# Patient Record
Sex: Female | Born: 1990 | Race: White | Hispanic: No | Marital: Married | Smoking: Current every day smoker
Health system: Southern US, Community
[De-identification: ages and names within clinical notes are randomized; demographics above are authoritative.]

## PROBLEM LIST (undated history)

## (undated) DIAGNOSIS — F419 Anxiety disorder, unspecified: Secondary | ICD-10-CM

## (undated) DIAGNOSIS — Z87440 Personal history of urinary (tract) infections: Secondary | ICD-10-CM

## (undated) DIAGNOSIS — F32A Depression, unspecified: Secondary | ICD-10-CM

## (undated) DIAGNOSIS — Z8742 Personal history of other diseases of the female genital tract: Secondary | ICD-10-CM

## (undated) DIAGNOSIS — R011 Cardiac murmur, unspecified: Secondary | ICD-10-CM

## (undated) DIAGNOSIS — R87619 Unspecified abnormal cytological findings in specimens from cervix uteri: Secondary | ICD-10-CM

## (undated) DIAGNOSIS — F319 Bipolar disorder, unspecified: Secondary | ICD-10-CM

## (undated) DIAGNOSIS — E119 Type 2 diabetes mellitus without complications: Secondary | ICD-10-CM

## (undated) DIAGNOSIS — F329 Major depressive disorder, single episode, unspecified: Secondary | ICD-10-CM

## (undated) DIAGNOSIS — Z8759 Personal history of other complications of pregnancy, childbirth and the puerperium: Secondary | ICD-10-CM

## (undated) DIAGNOSIS — I10 Essential (primary) hypertension: Secondary | ICD-10-CM

## (undated) HISTORY — DX: Anxiety disorder, unspecified: F41.9

## (undated) HISTORY — DX: Depression, unspecified: F32.A

## (undated) HISTORY — DX: Bipolar disorder, unspecified: F31.9

## (undated) HISTORY — DX: Essential (primary) hypertension: I10

## (undated) HISTORY — DX: Cardiac murmur, unspecified: R01.1

## (undated) HISTORY — DX: Major depressive disorder, single episode, unspecified: F32.9

## (undated) HISTORY — DX: Personal history of urinary (tract) infections: Z87.440

## (undated) HISTORY — DX: Type 2 diabetes mellitus without complications: E11.9

## (undated) HISTORY — DX: Personal history of other diseases of the female genital tract: Z87.42

## (undated) HISTORY — DX: Unspecified abnormal cytological findings in specimens from cervix uteri: R87.619

## (undated) HISTORY — PX: COLPOSCOPY: SHX161

## (undated) HISTORY — DX: Personal history of other complications of pregnancy, childbirth and the puerperium: Z87.59

---

## 2018-10-29 ENCOUNTER — Other Ambulatory Visit (HOSPITAL_COMMUNITY)
Admission: RE | Admit: 2018-10-29 | Discharge: 2018-10-29 | Disposition: A | Discharge status: Home / Self Care | Payer: BLUE CROSS/BLUE SHIELD | Source: Ambulatory Visit | Attending: Certified Nurse Midwife | Admitting: Certified Nurse Midwife

## 2018-10-29 ENCOUNTER — Encounter: Payer: Self-pay | Admitting: Certified Nurse Midwife

## 2018-10-29 ENCOUNTER — Ambulatory Visit (INDEPENDENT_AMBULATORY_CARE_PROVIDER_SITE_OTHER): Payer: BLUE CROSS/BLUE SHIELD | Admitting: Certified Nurse Midwife

## 2018-10-29 VITALS — BP 126/92 | HR 89 | Ht 66.0 in | Wt 194.6 lb

## 2018-10-29 DIAGNOSIS — N941 Unspecified dyspareunia: Secondary | ICD-10-CM

## 2018-10-29 DIAGNOSIS — Z124 Encounter for screening for malignant neoplasm of cervix: Secondary | ICD-10-CM

## 2018-10-29 DIAGNOSIS — Z8632 Personal history of gestational diabetes: Secondary | ICD-10-CM

## 2018-10-29 DIAGNOSIS — Z01419 Encounter for gynecological examination (general) (routine) without abnormal findings: Secondary | ICD-10-CM

## 2018-10-29 NOTE — Progress Notes (Signed)
GYNECOLOGY ANNUAL PREVENTATIVE CARE ENCOUNTER NOTE  Subjective:   Janice Kaufman is a 28 y.o. [id number] female here for a routine annual gynecologic exam.  Current complaints: occasional painful intercourse x 3 yrs.   Denies abnormal vaginal bleeding, discharge, pelvic pain, problems with intercourse or other gynecologic concerns.    Gynecologic History Patient's last menstrual period was 10/29/2018 (exact date). Contraception: condoms, family planning  Last Pap: 2018. Results were: abnormal pt unsure of result  Last mammogram: n/a.  Obstetric History OB History  Gravida Para Term Preterm AB Living  3 3 3     3   SAB TAB Ectopic Multiple Live Births          3    # Outcome Date GA Lbr Len/2nd Weight Sex Delivery Anes PTL Lv  3 Term 2018   7 lb 7 oz (3.374 kg) M Vag-Spont   LIV  2 Term 2013   8 lb 7 oz (3.827 kg) M Vag-Spont   LIV  1 Term 2012   6 lb 3 oz (2.807 kg) F Vag-Spont   LIV    Past Medical History:  Diagnosis Date  . Anxiety   . Depression     History reviewed. No pertinent surgical history.  No current outpatient medications on file prior to visit.   No current facility-administered medications on file prior to visit.   History of GDM , pre eclampsia, and gestational hypertension  No Known Allergies  Social History:  reports that she has been smoking. She has never used smokeless tobacco. She reports that she does not drink alcohol or use drugs.  Family History  Problem Relation Age of Onset  . Cancer Mother   . Diabetes Mother   . Thyroid disease Mother   . Ovarian cancer Paternal Grandmother     The following portions of the patient's history were reviewed and updated as appropriate: allergies, current medications, past family history, past medical history, past social history, past surgical history and problem list.  Pt smokes pack a day x 12 yrs Denies alcohol, drugs.  Exercise: none.  Review of Systems Pertinent items noted in HPI and remainder  of comprehensive ROS otherwise negative.   Objective:  BP (!) 128/100   Pulse 96   Ht 5\' 6"  (1.676 m)   Wt 194 lb 9 oz (88.3 kg)   LMP 10/29/2018 (Exact Date)   BMI 31.40 kg/m  CONSTITUTIONAL: Well-developed, well-nourished, obese female in no acute distress.  HENT:  Normocephalic, atraumatic, External right and left ear normal. Oropharynx is clear and moist EYES: Conjunctivae and EOM are normal. Pupils are equal, round, and reactive to light. No scleral icterus.  NECK: Normal range of motion, supple, no masses.  Normal thyroid.  SKIN: Skin is warm and dry. No rash noted. Not diaphoretic. No erythema. No pallor. MUSCULOSKELETAL: Normal range of motion. No tenderness.  No cyanosis, clubbing, or edema.  2+ distal pulses. NEUROLOGIC: Alert and oriented to person, place, and time. Normal reflexes, muscle tone coordination. No cranial nerve deficit noted. PSYCHIATRIC: Normal mood and affect. Normal behavior. Normal judgment and thought content. CARDIOVASCULAR: Normal heart rate noted, regular rhythm RESPIRATORY: Clear to auscultation bilaterally. Effort and breath sounds normal, no problems with respiration noted. BREASTS: Symmetric in size. No masses, skin changes, nipple drainage, or lymphadenopathy. ABDOMEN: Soft, normal bowel sounds, no distention noted.  No tenderness, rebound or guarding.  PELVIC: Normal appearing external genitalia; normal appearing vaginal mucosa and cervix.  No abnormal discharge noted. Pt started period today. Moderate  amount of blood present  Pap smear obtained.  Normal uterine size, no other palpable masses, no uterine or adnexal tenderness.    Assessment and Plan:  Women's annual routine GYN exam  Will follow up results of pap smear and manage accordingly. Mammogram n/a Labs: lipid profile, Hemoglobin A 1 C  Future: u/s pelvic dyspareunia   She denies problems with BP outside of pregnancy. She states is usually 120/80's.Repeat BP 126/92. Pt encouraged to keep  watch on it and see PCP if BP remains elevated. She verbalizes understanding.   Routine preventative health maintenance measures emphasized. Please refer to After Visit Summary for other counseling recommendations.    Doreene Burke, CNM

## 2018-10-29 NOTE — Patient Instructions (Signed)
Preventive Care 18-39 Years, Female Preventive care refers to lifestyle choices and visits with your health care provider that can promote health and wellness. What does preventive care include?   A yearly physical exam. This is also called an annual well check.  Dental exams once or twice a year.  Routine eye exams. Ask your health care provider how often you should have your eyes checked.  Personal lifestyle choices, including: ? Daily care of your teeth and gums. ? Regular physical activity. ? Eating a healthy diet. ? Avoiding tobacco and drug use. ? Limiting alcohol use. ? Practicing safe sex. ? Taking vitamin and mineral supplements as recommended by your health care provider. What happens during an annual well check? The services and screenings done by your health care provider during your annual well check will depend on your age, overall health, lifestyle risk factors, and family history of disease. Counseling Your health care provider may ask you questions about your:  Alcohol use.  Tobacco use.  Drug use.  Emotional well-being.  Home and relationship well-being.  Sexual activity.  Eating habits.  Work and work Statistician.  Method of birth control.  Menstrual cycle.  Pregnancy history. Screening You may have the following tests or measurements:  Height, weight, and BMI.  Diabetes screening. This is done by checking your blood sugar (glucose) after you have not eaten for a while (fasting).  Blood pressure.  Lipid and cholesterol levels. These may be checked every 5 years starting at age 31.  Skin check.  Hepatitis C blood test.  Hepatitis B blood test.  Sexually transmitted disease (STD) testing.  BRCA-related cancer screening. This may be done if you have a family history of breast, ovarian, tubal, or peritoneal cancers.  Pelvic exam and Pap test. This may be done every 3 years starting at age 18. Starting at age 67, this may be done every 5  years if you have a Pap test in combination with an HPV test. Discuss your test results, treatment options, and if necessary, the need for more tests with your health care provider. Vaccines Your health care provider may recommend certain vaccines, such as:  Influenza vaccine. This is recommended every year.  Tetanus, diphtheria, and acellular pertussis (Tdap, Td) vaccine. You may need a Td booster every 10 years.  Varicella vaccine. You may need this if you have not been vaccinated.  HPV vaccine. If you are 53 or younger, you may need three doses over 6 months.  Measles, mumps, and rubella (MMR) vaccine. You may need at least one dose of MMR. You may also need a second dose.  Pneumococcal 13-valent conjugate (PCV13) vaccine. You may need this if you have certain conditions and were not previously vaccinated.  Pneumococcal polysaccharide (PPSV23) vaccine. You may need one or two doses if you smoke cigarettes or if you have certain conditions.  Meningococcal vaccine. One dose is recommended if you are age 44-21 years and a first-year college student living in a residence hall, or if you have one of several medical conditions. You may also need additional booster doses.  Hepatitis A vaccine. You may need this if you have certain conditions or if you travel or work in places where you may be exposed to hepatitis A.  Hepatitis B vaccine. You may need this if you have certain conditions or if you travel or work in places where you may be exposed to hepatitis B.  Haemophilus influenzae type b (Hib) vaccine. You may need this if you  have certain risk factors. Talk to your health care provider about which screenings and vaccines you need and how often you need them. This information is not intended to replace advice given to you by your health care provider. Make sure you discuss any questions you have with your health care provider. Document Released: 10/28/2001 Document Revised: 04/14/2017  Document Reviewed: 07/03/2015 Elsevier Interactive Patient Education  2019 [employer].

## 2018-10-30 LAB — LIPID PANEL
Chol/HDL Ratio: 5.6 ratio — ABNORMAL HIGH (ref 0.0–4.4)
Cholesterol, Total: 146 mg/dL (ref 100–199)
HDL: 26 mg/dL — ABNORMAL LOW (ref >39)
LDL CALC: 81 mg/dL (ref 0–99)
Triglycerides: 193 mg/dL — ABNORMAL HIGH (ref 0–149)
VLDL Cholesterol Cal: 39 mg/dL (ref 5–40)

## 2018-10-30 LAB — HEMOGLOBIN A1C
Est. average glucose Bld gHb Est-mCnc: 232 mg/dL
Hgb A1c MFr Bld: 9.7 % — ABNORMAL HIGH (ref 4.8–5.6)

## 2018-11-01 ENCOUNTER — Other Ambulatory Visit: Payer: Self-pay | Admitting: Certified Nurse Midwife

## 2018-11-01 DIAGNOSIS — R7309 Other abnormal glucose: Secondary | ICD-10-CM

## 2018-11-01 NOTE — Progress Notes (Signed)
Elevated hemoglobin A1c in annual exam. Order placed for referral to endocrinology.   Doreene Burke, CNM

## 2018-11-02 ENCOUNTER — Ambulatory Visit (INDEPENDENT_AMBULATORY_CARE_PROVIDER_SITE_OTHER): Payer: BLUE CROSS/BLUE SHIELD

## 2018-11-02 DIAGNOSIS — N941 Unspecified dyspareunia: Secondary | ICD-10-CM

## 2018-11-04 LAB — CYTOLOGY - PAP
HPV 16/18/45 genotyping: POSITIVE — AB
HPV: DETECTED — AB

## 2018-11-05 ENCOUNTER — Telehealth: Payer: Self-pay

## 2018-11-05 NOTE — Telephone Encounter (Signed)
Per ATs orders- info mailed.

## 2018-11-16 ENCOUNTER — Other Ambulatory Visit: Payer: Self-pay

## 2018-11-16 ENCOUNTER — Encounter: Payer: Self-pay | Admitting: Internal Medicine

## 2018-11-16 ENCOUNTER — Ambulatory Visit (INDEPENDENT_AMBULATORY_CARE_PROVIDER_SITE_OTHER): Payer: BLUE CROSS/BLUE SHIELD | Admitting: Internal Medicine

## 2018-11-16 VITALS — BP 136/88 | HR 79 | Ht 66.0 in | Wt 196.0 lb

## 2018-11-16 DIAGNOSIS — E1165 Type 2 diabetes mellitus with hyperglycemia: Secondary | ICD-10-CM | POA: Diagnosis not present

## 2018-11-16 LAB — BASIC METABOLIC PANEL
BUN: 12 mg/dL (ref 6–23)
CO2: 22 mEq/L (ref 19–32)
Calcium: 9.2 mg/dL (ref 8.4–10.5)
Chloride: 101 mEq/L (ref 96–112)
Creatinine, Ser: 0.58 mg/dL (ref 0.40–1.20)
GFR: 126.28 mL/min (ref >60.00)
Glucose, Bld: 239 mg/dL — ABNORMAL HIGH (ref 70–99)
Potassium: 4 mEq/L (ref 3.5–5.1)
Sodium: 133 mEq/L — ABNORMAL LOW (ref 135–145)

## 2018-11-16 LAB — MICROALBUMIN / CREATININE URINE RATIO
Creatinine,U: 86.5 mg/dL
MICROALB/CREAT RATIO: 66.7 mg/g — AB (ref 0.0–30.0)
Microalb, Ur: 57.7 mg/dL — ABNORMAL HIGH (ref 0.0–1.9)

## 2018-11-16 MED ORDER — METFORMIN HCL 500 MG PO TABS: 1000.0000 mg | ORAL_TABLET | Freq: Two times a day (BID) | ORAL | 6 refills | Status: DC

## 2018-11-16 NOTE — Progress Notes (Signed)
Name: Janice Kaufman  MRN/ DOB: [id number], 1991-06-30   Age/ Sex: 28 y.o., female    PCP: Patient, No Pcp Per   Reason for Endocrinology Evaluation: Type 2 Diabetes Mellitus     Date of Initial Endocrinology Visit: 11/17/2018     PATIENT IDENTIFIER: Janice Kaufman is a 28 y.o. female with a past medical history of gestational diabetes and preeclampsia. The patient presented for initial endocrinology clinic visit on 11/17/2018 for consultative assistance with her diabetes management.    HPI: Janice Kaufman was    Diagnosed with Gestational diabetes during her 3rd pregnancy of her currently 62 month old boy, she was supposed to have repeat testing but never did. During her teenage years, she was diagnosed with pre-diabetes and started on Metformin , that she took for a year, she denies any side effects with it. Prior Medications tried/Intolerance: Metformin - denies intolerance.  Currently checking blood sugars 0 x / day Hypoglycemia episodes : No          Hemoglobin A1c 9.7 %  in 2020, no prior records. Patient required assistance for hypoglycemia: no Patient has required hospitalization within the last 1 year from hyper or hypoglycemia: no  In terms of diet, the patient avoids sugar- sweetened beverages, she eats 2-3 meals a day and 2 snacks.    HOME DIABETES REGIMEN: N/A   Statin: yes ACE-I/ARB: yes Prior Diabetic Education: No   METER DOWNLOAD SUMMARY: Does not check    DIABETIC COMPLICATIONS: Microvascular complications:    Denies: neuropathy, retinopathy, CKD  Last eye exam: Completed 2019  Macrovascular complications:    Denies: CAD, PVD, CVA   PAST HISTORY: Past Medical History:  Past Medical History:  Diagnosis Date  . Anxiety   . Depression     Past Surgical History: History reviewed. No pertinent surgical history.   Social History:  reports that she has been smoking. She has never used smokeless tobacco. She reports that she does not drink  alcohol or use drugs. Family History:  Family History  Problem Relation Age of Onset  . Cancer Mother   . Diabetes Mother   . Thyroid disease Mother   . Ovarian cancer Paternal Grandmother      HOME MEDICATIONS: Allergies as of 11/16/2018   No Known Allergies     Medication List       Accurate as of November 16, 2018 11:59 PM. Always use your most recent med list.        metFORMIN 500 MG tablet Commonly known as:  GLUCOPHAGE Take 2 tablets (1,000 mg total) by mouth 2 (two) times daily with a meal.        ALLERGIES: No Known Allergies   REVIEW OF SYSTEMS: A comprehensive ROS was conducted with the patient and is negative except as per HPI and below:  Review of Systems  Constitutional: Negative for chills and fever.  HENT: Negative for congestion and sore throat.   Eyes: Positive for blurred vision. Negative for pain.  Respiratory: Positive for shortness of breath. Negative for cough.   Cardiovascular: Negative for chest pain and palpitations.  Gastrointestinal: Negative for diarrhea and nausea.  Genitourinary: Positive for frequency.  Neurological: Positive for tingling. Negative for tremors.  Endo/Heme/Allergies: Positive for polydipsia.  Psychiatric/Behavioral: Positive for depression. The patient is nervous/anxious.       OBJECTIVE:   VITAL SIGNS: BP 136/88 (BP Location: Left Arm, Patient Position: Sitting, Cuff Size: Normal)   Pulse 79   Ht 5\' 6"  (1.676 m)  Wt 196 lb (88.9 kg)   LMP 10/29/2018 (Exact Date)   SpO2 96%   BMI 31.64 kg/m    PHYSICAL EXAM:  General: Pt appears well and is in NAD  Hydration: Well-hydrated with moist mucous membranes and good skin turgor  HEENT: Head: Unremarkable with good dentition. Oropharynx clear without exudate.  Eyes: External eye exam normal without stare, lid lag or exophthalmos.  EOM intact.   Neck: General: Supple without adenopathy or carotid bruits. Thyroid: Thyroid size normal.  No goiter or nodules  appreciated. No thyroid bruit.  Lungs: Clear with good BS bilat with no rales, rhonchi, or wheezes  Heart: RRR with normal S1 and S2 and no gallops; no murmurs; no rub  Abdomen: Normoactive bowel sounds, soft, nontender, without masses or organomegaly palpable  Extremities:  Lower extremities - No pretibial edema. No lesions.  Skin: Normal texture and temperature to palpation. No rash noted. No Acanthosis nigricans/skin tags.  Neuro: MS is good with appropriate affect, pt is alert and Ox3    DM foot exam: 11/16/2018 The skin of the feet is intact without sores or ulcerations. The pedal pulses are 2+ on right and 2+ on left. The sensation is intact to a screening 5.07, 10 gram monofilament bilaterally   DATA REVIEWED:  Lab Results  Component Value Date   HGBA1C 9.7 (H) 10/29/2018   Lab Results  Component Value Date   MICROALBUR 57.7 (H) 11/16/2018   LDLCALC 81 10/29/2018   CREATININE 0.58 11/16/2018   Lab Results  Component Value Date   MICRALBCREAT 66.7 (H) 11/16/2018    Lab Results  Component Value Date   CHOL 146 10/29/2018   HDL 26 (L) 10/29/2018   LDLCALC 81 10/29/2018   TRIG 193 (H) 10/29/2018   CHOLHDL 5.6 (H) 10/29/2018       Results for Janice Kaufman, Janice Kaufman (MRN [id number]) as of 11/17/2018 07:44  Ref. Range 11/16/2018 11:21  Sodium Latest Ref Range: 135 - 145 mEq/L 133 (L)  Potassium Latest Ref Range: 3.5 - 5.1 mEq/L 4.0  Chloride Latest Ref Range: 96 - 112 mEq/L 101  CO2 Latest Ref Range: 19 - 32 mEq/L 22  Glucose Latest Ref Range: 70 - 99 mg/dL 782 (H)  BUN Latest Ref Range: 6 - 23 mg/dL 12  Creatinine Latest Ref Range: 0.40 - 1.20 mg/dL 9.56  Calcium Latest Ref Range: 8.4 - 10.5 mg/dL 9.2  GFR Latest Ref Range: >60.00 mL/min 126.28   ASSESSMENT / PLAN / RECOMMENDATIONS:   1) Type 2 Diabetes Mellitus, Poorly controlled, Without complications - Most recent A1c of 9.7 %. Goal A1c < 7.0 %.    Plan: GENERAL: I have discussed with the patient the pathophysiology  of diabetes. We went over the natural progression of the disease. We talked about both insulin resistance and insulin deficiency. We stressed the importance of lifestyle changes including diet and exercise. I explained the complications associated with diabetes including retinopathy, nephropathy, neuropathy as well as increased risk of cardiovascular disease. We went over the benefit seen with glycemic control.    I explained to the patient that diabetic patients are at higher than normal risk for amputations. The patient was informed that diabetes is the number one cause of non-traumatic amputations in [place].    Discussed the importance of avoiding sugar- sweetened beverages and avoiding snacks, discussed low carb options for snacks.   Will refer her to our CDE.   She will lose insurance by April, she was directed to use "ReliOn meter" as  its the most affordable meter on the market at this time, she was also referred to [facility] to establish with a PCP.    MEDICATIONS:  Metformin 500 mg XR 2 tabs Bid - titration provided.   EDUCATION / INSTRUCTIONS:  BG monitoring instructions: Patient is instructed to check her blood sugars 1 times a day, fasting.  Call [place] [facility] if: BG persistently < 70 or > 300. . I reviewed the Rule of 15 for the treatment of hypoglycemia in detail with the patient. Literature supplied.   2) Diabetic complications:   Eye: Does not have known diabetic retinopathy. She was encouraged to see an ophthalmologist ASAP with her new diagnosis   Neuro/ Feet: Does not have known diabetic peripheral neuropathy.  Renal: Patient does not have known baseline CKD. She is not on an ACEI/ARB at present. 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    3) Lipids: Patient is not on a statin. Not indicated until age  85.    4) Hypertension: Historically she has had high BP, including pre-eclampsia with her first pregnancy, HTn with her second pregnancy. Today she is at goal of < 140/90 mmHg.    F/u in 6 weeks    Signed electronically by: Lyndle Herrlich, MD  [facility]  [facility] [address] [place], [place] [address] Phone: [phone] FAX: [phone]   CC: Patient, No Pcp Per No address on file Phone: None  Fax: None    Return to Endocrinology clinic as below: Future Appointments  Date Time Provider Department Center  11/24/2018 11:15 AM Purcell Nails, CNM [facility] None  12/29/2018 10:30 AM Nakeisha Greenhouse, Konrad Dolores, MD [facility] None  10/31/2019  9:00 AM Doreene Burke, CNM [facility] None

## 2018-11-16 NOTE — Patient Instructions (Addendum)
-   Check sugar once a day  Fasting (before breakfast ) - Fasting goal of sugar is 70-130 mg/dL  - Start Metformin  1 pill daily with breakfast for 1 week, then increase to 1 pill twice a day (breakfast and supper) x 1 week, then increase to 2 pills with breakfast  and 1 pill with supper , finally 2 pills twice a day with meals   - If you have nausea or diarrhea, while on metformin, please do not go up on the dose.   - Choose healthy, lower carb lower calorie snacks: toss salad, cooked vegetables, cottage cheese, peanut butter, low fat cheese / string cheese, lower sodium deli meat, tuna salad or chicken salad   - Get the ReliOn meter from [employer] with strips - [facility] to establish with a Primary care physician.

## 2018-11-24 ENCOUNTER — Encounter: Payer: Self-pay | Admitting: Obstetrics and Gynecology

## 2018-11-24 ENCOUNTER — Other Ambulatory Visit: Payer: Self-pay

## 2018-11-24 ENCOUNTER — Ambulatory Visit (INDEPENDENT_AMBULATORY_CARE_PROVIDER_SITE_OTHER): Payer: BLUE CROSS/BLUE SHIELD | Admitting: Obstetrics and Gynecology

## 2018-11-24 VITALS — BP 127/84 | HR 86 | Ht 66.0 in | Wt 193.8 lb

## 2018-11-24 DIAGNOSIS — B3731 Acute candidiasis of vulva and vagina: Secondary | ICD-10-CM

## 2018-11-24 DIAGNOSIS — B373 Candidiasis of vulva and vagina: Secondary | ICD-10-CM

## 2018-11-24 DIAGNOSIS — R87810 Cervical high risk human papillomavirus (HPV) DNA test positive: Secondary | ICD-10-CM | POA: Diagnosis not present

## 2018-11-24 DIAGNOSIS — R87610 Atypical squamous cells of undetermined significance on cytologic smear of cervix (ASC-US): Secondary | ICD-10-CM | POA: Diagnosis not present

## 2018-11-24 MED ORDER — TERCONAZOLE 0.4 % VA CREA: 1.0000 | TOPICAL_CREAM | Freq: Every day | VAGINAL | 0 refills | Status: DC

## 2018-11-24 NOTE — Patient Instructions (Signed)
Vaginal Yeast infection, Adult    Vaginal yeast infection is a condition that causes vaginal discharge as well as soreness, swelling, and redness (inflammation) of the vagina. This is a common condition. Some women get this infection frequently.  What are the causes?  This condition is caused by a change in the normal balance of the yeast (candida) and bacteria that live in the vagina. This change causes an overgrowth of yeast, which causes the inflammation.  What increases the risk?  The condition is more likely to develop in women who:   Take antibiotic medicines.   Have diabetes.   Take birth control pills.   Are pregnant.   Douche often.   Have a weak body defense system (immune system).   Have been taking steroid medicines for a long time.   Frequently wear tight clothing.  What are the signs or symptoms?  Symptoms of this condition include:   White, thick, creamy vaginal discharge.   Swelling, itching, redness, and irritation of the vagina. The lips of the vagina (vulva) may be affected as well.   Pain or a burning feeling while urinating.   Pain during sex.  How is this diagnosed?  This condition is diagnosed based on:   Your medical history.   A physical exam.   A pelvic exam. Your health care provider will examine a sample of your vaginal discharge under a microscope. Your health care provider may send this sample for testing to confirm the diagnosis.  How is this treated?  This condition is treated with medicine. Medicines may be over-the-counter or prescription. You may be told to use one or more of the following:   Medicine that is taken by mouth (orally).   Medicine that is applied as a cream (topically).   Medicine that is inserted directly into the vagina (suppository).  Follow these instructions at home:    Lifestyle   Do not have sex until your health care provider approves. Tell your sex partner that you have a yeast infection. That person should go to his or her health care  provider and ask if they should also be treated.   Do not wear tight clothes, such as pantyhose or tight pants.   Wear breathable cotton underwear.  General instructions   Take or apply over-the-counter and prescription medicines only as told by your health care provider.   Eat more yogurt. This may help to keep your yeast infection from returning.   Do not use tampons until your health care provider approves.   Try taking a sitz bath to help with discomfort. This is a warm water bath that is taken while you are sitting down. The water should only come up to your hips and should cover your buttocks. Do this 3-4 times per day or as told by your health care provider.   Do not douche.   If you have diabetes, keep your blood sugar levels under control.   Keep all follow-up visits as told by your health care provider. This is important.  Contact a health care provider if:   You have a fever.   Your symptoms go away and then return.   Your symptoms do not get better with treatment.   Your symptoms get worse.   You have new symptoms.   You develop blisters in or around your vagina.   You have blood coming from your vagina and it is not your menstrual period.   You develop pain in your abdomen.  Summary     Vaginal yeast infection is a condition that causes discharge as well as soreness, swelling, and redness (inflammation) of the vagina.   This condition is treated with medicine. Medicines may be over-the-counter or prescription.   Take or apply over-the-counter and prescription medicines only as told by your health care provider.   Do not douche. Do not have sex or use tampons until your health care provider approves.   Contact a health care provider if your symptoms do not get better with treatment or your symptoms go away and then return.  This information is not intended to replace advice given to you by your health care provider. Make sure you discuss any questions you have with your health care  provider.  Document Released: 06/11/2005 Document Revised: 01/18/2018 Document Reviewed: 01/18/2018  Elsevier Interactive Patient Education  2019 [employer]

## 2018-11-24 NOTE — Progress Notes (Signed)
[facility]PASS COLPOSCOPY PROCEDURE NOTE  28 y.o. [id number] here for colposcopy for ASCUS with POSITIVE high risk HPV pap smear on 10/29/2018. Discussed role for HPV in cervical dysplasia, need for surveillance.  Patient given informed consent, signed copy in the chart, time out was performed.  Placed in lithotomy position. Cervix viewed with speculum and large amounts of green thick discharge in upper vaginal vault and on cervix Microscopic wet-mount exam shows hyphae, lactobacilli, monilia, spermatozoa noted.  Explained need to treat yeast infection first and then have her return after next menses (due next week) to try for colpo. terazol 7 prescription sent in and instructed on use.       Melody Suzan Nailer, CNM

## 2018-12-06 ENCOUNTER — Encounter: Payer: Self-pay | Admitting: *Deleted

## 2018-12-08 ENCOUNTER — Encounter: Payer: BLUE CROSS/BLUE SHIELD | Admitting: Obstetrics and Gynecology

## 2018-12-29 ENCOUNTER — Ambulatory Visit: Payer: BLUE CROSS/BLUE SHIELD | Admitting: Internal Medicine

## 2019-02-02 ENCOUNTER — Encounter: Payer: Self-pay | Admitting: Obstetrics and Gynecology

## 2019-09-02 ENCOUNTER — Other Ambulatory Visit: Payer: Self-pay

## 2019-09-02 ENCOUNTER — Ambulatory Visit (LOCAL_COMMUNITY_HEALTH_CENTER): Payer: Medicaid Other

## 2019-09-02 VITALS — BP 149/92 | Ht 66.0 in | Wt 189.0 lb

## 2019-09-02 DIAGNOSIS — Z3201 Encounter for pregnancy test, result positive: Secondary | ICD-10-CM | POA: Diagnosis not present

## 2019-09-02 LAB — PREGNANCY, URINE: Preg Test, Ur: POSITIVE — AB

## 2019-09-02 NOTE — Progress Notes (Signed)
Last took Metformin (Type !! DM) end of 04/2019 as unsure if has Medicaid. Per client, feeling baby move. Encouraged [facility] appt ASAP due to untreated DM, GA and elevated BP today. Plans PNC at [facility] and given Pregnancy Resource List which has their phone number. Rich Number, RN

## 2019-09-13 ENCOUNTER — Other Ambulatory Visit: Payer: Self-pay | Admitting: Certified Nurse Midwife

## 2019-09-13 ENCOUNTER — Encounter: Payer: BLUE CROSS/BLUE SHIELD | Admitting: Certified Nurse Midwife

## 2019-09-13 ENCOUNTER — Other Ambulatory Visit: Payer: Self-pay

## 2019-09-13 ENCOUNTER — Ambulatory Visit (INDEPENDENT_AMBULATORY_CARE_PROVIDER_SITE_OTHER): Payer: Medicaid Other

## 2019-09-13 DIAGNOSIS — Z3492 Encounter for supervision of normal pregnancy, unspecified, second trimester: Secondary | ICD-10-CM

## 2019-09-13 DIAGNOSIS — Z3A25 25 weeks gestation of pregnancy: Secondary | ICD-10-CM | POA: Diagnosis not present

## 2019-09-13 DIAGNOSIS — Z363 Encounter for antenatal screening for malformations: Secondary | ICD-10-CM | POA: Diagnosis not present

## 2019-09-16 NOTE — L&D Delivery Note (Signed)
Delivery Note  Called by RN, SVE: 10/100/+1, vertex.   1035 In room to see patient, effective coached maternal pushing efforts noted.   Spontaneous vaginal birth of liveborn female infant ([name]) in right occiput anterior presentation over intact perineum at 1100. Infant immediately to maternal abdomen. Loose nuchal cord x 1 reduced on perineum. Delayed cord clamping, skin to skin, three (3) vessel cord. APGARS: 8,9. Weight pending. Receiving nurse present at bedside for birth.   Pitocin bolus infusing, see chart. Delivery of intact placenta at 1105. Fundus firm. Rubra small. Vault check completed under adequate epidural anesthesia. QBL pending.   Mom to postpartum.Baby to Couplet care / Skin to Skin. Initiate routine postpartum orders and care.    Gunnar Bulla, CNM [facility], [facility] 12/01/2019, 5:16 PM

## 2019-09-21 ENCOUNTER — Encounter: Payer: BLUE CROSS/BLUE SHIELD | Admitting: Certified Nurse Midwife

## 2019-09-21 ENCOUNTER — Other Ambulatory Visit: Payer: BLUE CROSS/BLUE SHIELD

## 2019-09-23 ENCOUNTER — Encounter: Payer: Self-pay | Admitting: Certified Nurse Midwife

## 2019-09-23 ENCOUNTER — Other Ambulatory Visit: Payer: Self-pay

## 2019-09-23 ENCOUNTER — Telehealth: Payer: Self-pay

## 2019-09-23 ENCOUNTER — Ambulatory Visit (INDEPENDENT_AMBULATORY_CARE_PROVIDER_SITE_OTHER): Payer: Medicaid Other | Admitting: Certified Nurse Midwife

## 2019-09-23 VITALS — BP 136/88 | HR 118 | Wt 191.4 lb

## 2019-09-23 DIAGNOSIS — Z3A26 26 weeks gestation of pregnancy: Secondary | ICD-10-CM

## 2019-09-23 DIAGNOSIS — F17210 Nicotine dependence, cigarettes, uncomplicated: Secondary | ICD-10-CM

## 2019-09-23 DIAGNOSIS — O99332 Smoking (tobacco) complicating pregnancy, second trimester: Secondary | ICD-10-CM

## 2019-09-23 DIAGNOSIS — Z3480 Encounter for supervision of other normal pregnancy, unspecified trimester: Secondary | ICD-10-CM

## 2019-09-23 DIAGNOSIS — O09292 Supervision of pregnancy with other poor reproductive or obstetric history, second trimester: Secondary | ICD-10-CM

## 2019-09-23 DIAGNOSIS — Z8632 Personal history of gestational diabetes: Secondary | ICD-10-CM

## 2019-09-23 DIAGNOSIS — F17200 Nicotine dependence, unspecified, uncomplicated: Secondary | ICD-10-CM | POA: Insufficient documentation

## 2019-09-23 DIAGNOSIS — Z8759 Personal history of other complications of pregnancy, childbirth and the puerperium: Secondary | ICD-10-CM | POA: Insufficient documentation

## 2019-09-23 LAB — POCT URINALYSIS DIPSTICK OB
Bilirubin, UA: NEGATIVE
Blood, UA: NEGATIVE
Glucose, UA: NEGATIVE
Ketones, UA: NEGATIVE
Leukocytes, UA: NEGATIVE
Nitrite, UA: NEGATIVE
POC,PROTEIN,UA: NEGATIVE
Spec Grav, UA: 1.01 (ref 1.010–1.025)
Urobilinogen, UA: 0.2 E.U./dL
pH, UA: 6.5 (ref 5.0–8.0)

## 2019-09-23 MED ORDER — ASPIRIN EC 81 MG PO TBEC: 81.0000 mg | DELAYED_RELEASE_TABLET | Freq: Every day | ORAL | 3 refills | Status: DC

## 2019-09-23 NOTE — Patient Instructions (Signed)

## 2019-09-23 NOTE — Telephone Encounter (Signed)
mychart message sent - checking on status of mychart signup.

## 2019-09-23 NOTE — Progress Notes (Signed)
TRANSFER IN OB HISTORY AND PHYSICAL  SUBJECTIVE:       Janice Kaufman is a 29 y.o. [phone] female, Patient's last menstrual period was 03/18/2019 (exact date)., Estimated Date of Delivery: 12/30/19, [redacted]w[redacted]d, presents today for late entry [facility], state she did not no she was pregnant until after christmas. Complaints today include: none  Has signifigant history of pre eclampsia with 1st baby, gestational hypertension 2nd baby, and GDM with 3rd baby. After 3rd baby was dx with type 2 diabetes. She was put on metformin in March but stopped because it made her stomach hurt. She smokes 1/2 pack cigarettes a day ( she used to smoke 2 pack a day) .       Gynecologic History Patient's last menstrual period was 03/18/2019 (exact date). Normal Contraception: none Last Pap: 11/24/18. Results were: abnormal. 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 Pt was scheduled for colposcopy 11/24/18 but had active infection , did not return for follow up. ( states insurance lapsed)   Obstetric History OB History  Gravida Para Term Preterm AB Living  5 3 3   1 3   SAB TAB Ectopic Multiple Live Births  1       3    # Outcome Date GA Lbr Len/2nd Weight Sex Delivery Anes PTL Lv  5 Current           4 Term 2018   7 lb 7 oz (3.374 kg) M Vag-Spont   LIV  3 SAB 2016 [redacted]w[redacted]d         2 Term 2013   8 lb 7 oz (3.827 kg) M Vag-Spont   LIV  1 Term 2012   6 lb 3 oz (2.807 kg) F Vag-Spont   LIV    Past Medical History:  Diagnosis Date  . Abnormal Pap smear of cervix   . Anxiety   . Depression   . Diabetes mellitus (HCC)    Type 2  . History of abnormal cervical Pap smear   . History of gestational hypertension   . History of pre-eclampsia   . History of urinary tract infection     Past Surgical History:  Procedure Laterality Date  . COLPOSCOPY      Current Outpatient Medications on File Prior to Visit  Medication Sig Dispense Refill  . Prenatal Vit-Fe Fumarate-FA (MULTIVITAMIN-PRENATAL) 27-0.8 MG TABS tablet Take 1 tablet by  mouth daily at 12 noon.    . metFORMIN (GLUCOPHAGE) 500 MG tablet Take 2 tablets (1,000 mg total) by mouth 2 (two) times daily with a meal. (Patient not taking: Reported on 09/02/2019) 120 tablet 6  . terconazole (TERAZOL 7) 0.4 % vaginal cream Place 1 applicator vaginally at bedtime. (Patient not taking: Reported on 09/02/2019) 45 g 0   No current facility-administered medications on file prior to visit.    No Known Allergies  Social History   Socioeconomic History  . Marital status: Legally Separated    Spouse name: Not on file  . Number of children: Not on file  . Years of education: Not on file  . Highest education level: Not on file  Occupational History  . Not on file  Tobacco Use  . Smoking status: Current Every Day Smoker    Packs/day: 0.50    Years: 10.00    Pack years: 5.00    Types: Cigarettes  . Smokeless tobacco: Never Used  Substance and Sexual Activity  . Alcohol use: Not Currently    Comment: Last ETOH use one year ago (in 2019)  .  Drug use: Never  . Sexual activity: Yes    Birth control/protection: None    Comment: Last BCM (ocps) used 04/2019  Other Topics Concern  . Not on file  Social History Narrative  . Not on file   Social Determinants of Health   Financial Resource Strain:   . Difficulty of Paying Living Expenses: Not on file  Food Insecurity:   . Worried About Charity fundraiser in the Last Year: Not on file  . Ran Out of Food in the Last Year: Not on file  Transportation Needs:   . Lack of Transportation (Medical): Not on file  . Lack of Transportation (Non-Medical): Not on file  Physical Activity:   . Days of Exercise per Week: Not on file  . Minutes of Exercise per Session: Not on file  Stress:   . Feeling of Stress : Not on file  Social Connections:   . Frequency of Communication with Friends and Family: Not on file  . Frequency of Social Gatherings with Friends and Family: Not on file  . Attends Religious Services: Not on file  .  Active Member of Clubs or Organizations: Not on file  . Attends Archivist Meetings: Not on file  . Marital Status: Not on file  Intimate Partner Violence: Not At Risk  . Fear of Current or Ex-Partner: No  . Emotionally Abused: No  . Physically Abused: No  . Sexually Abused: No    Family History  Problem Relation Age of Onset  . Cancer Mother   . Diabetes Mother   . Thyroid disease Mother   . Ovarian cancer Paternal Grandmother     The following portions of the patient's history were reviewed and updated as appropriate: allergies, current medications, past OB history, past medical history, past surgical history, past family history, past social history, and problem list.    OBJECTIVE: Initial Physical Exam (New OB)  GENERAL APPEARANCE: alert, well appearing, in no apparent distress, oriented to person, place and time HEAD: normocephalic, atraumatic MOUTH: mucous membranes moist, pharynx normal without lesions THYROID: no thyromegaly or masses present BREASTS: no masses noted, no significant tenderness, no palpable axillary nodes, no skin changes LUNGS: wheezing noted bilaterally upper lobes, pt 2 pack a day smoker for the past 10 ys, states she hs decreased to 1/2 pack a day due to pregnancy.  HEART: regular rate and rhythm, no murmurs ABDOMEN: soft, nontender, nondistended, no abnormal masses, no epigastric pain, obese, fundus soft, nontender 26 cm and FHT present EXTREMITIES: no redness or tenderness in the calves or thighs, no edema, no limitation in range of motion SKIN: normal coloration and turgor, no rashes LYMPH NODES: no adenopathy palpable NEUROLOGIC: alert, oriented, normal speech, no focal findings or movement disorder noted  PELVIC EXAM Deffered, pap not due, tested pelvis  ASSESSMENT: Normal pregnancy  PLAN: Consulted Dr. Amalia Hailey on pt care plan. Labs completed today. Referral to lifestyles for diabetic teaching. Hem A 1 C today. Baseline pre E labs.  Discussed diet and exercise . Pt instructed to start 81 mg aspirin daily. She verbalizes and agrees to plan of care. Discussed MD and Midwifery care. Discussed importance of compliance and potentially becoming an MD pt if she risks out of midwifery care. She verbalizes understanding.   New OB counseling:  The patient has been given an overview regarding routine prenatal care. Recommendations regarding diet, weight gain, and exercise in pregnancy were given. Prenatal testing, optional genetic testing,carrier screening test,  and ultrasound use  in pregnancy were reviewed. She is undecided at this time. Benefits of Breast Feeding were discussed. The patient is encouraged to consider nursing her baby post partum.Follow up 2 wks.   Doreene Burke, CNM

## 2019-09-24 LAB — MICROSCOPIC EXAMINATION
Casts: NONE SEEN /lpf
Epithelial Cells (non renal): >10 /hpf — AB (ref 0–10)
WBC, UA: >30 /hpf — AB (ref 0–5)

## 2019-09-24 LAB — URINALYSIS, ROUTINE W REFLEX MICROSCOPIC
Bilirubin, UA: NEGATIVE
Nitrite, UA: POSITIVE — AB
Specific Gravity, UA: 1.024 (ref 1.005–1.030)
Urobilinogen, Ur: 0.2 mg/dL (ref 0.2–1.0)
pH, UA: 5 (ref 5.0–7.5)

## 2019-09-24 LAB — PROTEIN / CREATININE RATIO, URINE
Creatinine, Urine: 210.1 mg/dL
Protein, Ur: 57.3 mg/dL
Protein/Creat Ratio: 273 mg/g creat — ABNORMAL HIGH (ref 0–200)

## 2019-09-25 LAB — GC/CHLAMYDIA PROBE AMP
Chlamydia trachomatis, NAA: NEGATIVE
Neisseria Gonorrhoeae by PCR: NEGATIVE

## 2019-09-26 ENCOUNTER — Other Ambulatory Visit: Payer: Self-pay | Admitting: Certified Nurse Midwife

## 2019-09-26 LAB — VARICELLA ZOSTER ANTIBODY, IGG: Varicella zoster IgG: 192 index (ref >165)

## 2019-09-26 LAB — ANTIBODY SCREEN: Antibody Screen: NEGATIVE

## 2019-09-26 LAB — RPR: RPR Ser Ql: NONREACTIVE

## 2019-09-26 LAB — CBC WITH DIFFERENTIAL
Basophils Absolute: 0.1 10*3/uL (ref 0.0–0.2)
Basos: 0 %
EOS (ABSOLUTE): 0.2 10*3/uL (ref 0.0–0.4)
Eos: 1 %
Hematocrit: 40.3 % (ref 34.0–46.6)
Hemoglobin: 13.9 g/dL (ref 11.1–15.9)
Immature Grans (Abs): 0.3 10*3/uL — ABNORMAL HIGH (ref 0.0–0.1)
Immature Granulocytes: 2 %
Lymphocytes Absolute: 2.3 10*3/uL (ref 0.7–3.1)
Lymphs: 14 %
MCH: 29.9 pg (ref 26.6–33.0)
MCHC: 34.5 g/dL (ref 31.5–35.7)
MCV: 87 fL (ref 79–97)
Monocytes Absolute: 0.8 10*3/uL (ref 0.1–0.9)
Monocytes: 5 %
Neutrophils Absolute: 12.8 10*3/uL — ABNORMAL HIGH (ref 1.4–7.0)
Neutrophils: 78 %
RBC: 4.65 x10E6/uL (ref 3.77–5.28)
RDW: 12.8 % (ref 11.7–15.4)
WBC: 16.4 10*3/uL — ABNORMAL HIGH (ref 3.4–10.8)

## 2019-09-26 LAB — HEPATITIS B SURFACE ANTIGEN: Hepatitis B Surface Ag: NEGATIVE

## 2019-09-26 LAB — HEMOGLOBIN A1C
Est. average glucose Bld gHb Est-mCnc: 131 mg/dL
Hgb A1c MFr Bld: 6.2 % — ABNORMAL HIGH (ref 4.8–5.6)

## 2019-09-26 LAB — RUBELLA SCREEN: Rubella Antibodies, IGG: 0.99 index — ABNORMAL LOW (ref >0.99)

## 2019-09-26 LAB — HIV ANTIBODY (ROUTINE TESTING W REFLEX): HIV Screen 4th Generation wRfx: NONREACTIVE

## 2019-09-26 LAB — ABO AND RH: Rh Factor: POSITIVE

## 2019-09-26 MED ORDER — NITROFURANTOIN MONOHYD MACRO 100 MG PO CAPS: 100.0000 mg | ORAL_CAPSULE | Freq: Two times a day (BID) | ORAL | 0 refills | Status: AC

## 2019-09-26 NOTE — Progress Notes (Signed)
UTI , need toc in 4 wks. Macrobid ordered. Waiting for culture results. Pt notified.   Doreene Burke, CNM

## 2019-09-27 LAB — URINE CULTURE

## 2019-09-28 ENCOUNTER — Other Ambulatory Visit: Payer: Self-pay | Admitting: Certified Nurse Midwife

## 2019-09-28 ENCOUNTER — Encounter: Payer: Self-pay | Admitting: *Deleted

## 2019-09-28 ENCOUNTER — Other Ambulatory Visit: Payer: Medicaid Other

## 2019-09-28 ENCOUNTER — Other Ambulatory Visit: Payer: Self-pay

## 2019-09-28 ENCOUNTER — Encounter: Payer: Medicaid Other | Attending: Certified Nurse Midwife | Admitting: *Deleted

## 2019-09-28 VITALS — BP 118/80 | Ht 66.0 in | Wt 194.4 lb

## 2019-09-28 DIAGNOSIS — E119 Type 2 diabetes mellitus without complications: Secondary | ICD-10-CM | POA: Insufficient documentation

## 2019-09-28 DIAGNOSIS — Z3A00 Weeks of gestation of pregnancy not specified: Secondary | ICD-10-CM | POA: Insufficient documentation

## 2019-09-28 DIAGNOSIS — O24119 Pre-existing diabetes mellitus, type 2, in pregnancy, unspecified trimester: Secondary | ICD-10-CM | POA: Insufficient documentation

## 2019-09-28 DIAGNOSIS — Z3480 Encounter for supervision of other normal pregnancy, unspecified trimester: Secondary | ICD-10-CM

## 2019-09-28 DIAGNOSIS — Z713 Dietary counseling and surveillance: Secondary | ICD-10-CM | POA: Diagnosis present

## 2019-09-28 NOTE — Progress Notes (Signed)
Diabetes Self-Management Education  Visit Type: First/Initial  Appt. Start Time: 1435 Appt. End Time: 1545  09/28/2019  [name], identified by name and date of birth, is a 29 y.o. female with a diagnosis of Diabetes: Type 2(Pregnant).   ASSESSMENT  Blood pressure 118/80, height 5\' 6"  (1.676 m), weight 194 lb 6.4 oz (88.2 kg), last menstrual period 03/18/2019, estimated date of delivery 12/30/2019 Body mass index is 31.38 kg/m.  Diabetes Self-Management Education - 09/28/19 1558      Visit Information   Visit Type  First/Initial      Initial Visit   Diabetes Type  Type 2   Pregnant   Are you currently following a meal plan?  Yes    What type of meal plan do you follow?  "recently cut down on bread, pasta and soda intake"    Are you taking your medications as prescribed?  Yes    Date Diagnosed  Type 2 - 1 year ago      Health Coping   How would you rate your overall health?  Fair      Psychosocial Assessment   Patient Belief/Attitude about Diabetes  Motivated to manage diabetes   "ok" with diabetes   Self-care barriers  None    Self-management support  Doctor's office;Family    Patient Concerns  Nutrition/Meal planning;Glycemic Control;Monitoring;Weight Control;Healthy Lifestyle    Special Needs  None    Preferred Learning Style  Auditory;Visual    Learning Readiness  Change in progress    How often do you need to have someone help you when you read instructions, pamphlets, or other written materials from your doctor or pharmacy?  1 - Never    What is the last grade level you completed in school?  12th      Pre-Education Assessment   Patient understands the diabetes disease and treatment process.  Needs Instruction    Patient understands incorporating nutritional management into lifestyle.  Needs Instruction    Patient undertands incorporating physical activity into lifestyle.  Needs Instruction    Patient understands using medications safely.  Needs  Instruction    Patient understands monitoring blood glucose, interpreting and using results  Needs Review    Patient understands prevention, detection, and treatment of acute complications.  Needs Instruction    Patient understands prevention, detection, and treatment of chronic complications.  Needs Instruction    Patient understands how to develop strategies to address psychosocial issues.  Needs Instruction    Patient understands how to develop strategies to promote health/change behavior.  Needs Instruction      Complications   Last HgB A1C per patient/outside source  6.2 %   09/23/2019   How often do you check your blood sugar?  0 times/day (not testing)   Provided Accu-Chek Guide Me meter and instructed on use. BG upon return demonstration was 171 mg/dL at 11/21/2019 pm - 3 hrs pp. She had chicken nuggets and iced coffee that probably had sugar in it.   Have you had a dilated eye exam in the past 12 months?  No    Have you had a dental exam in the past 12 months?  No    Are you checking your feet?  No      Dietary Intake   Breakfast  egg, toast    Snack (morning)  cottage or fruit (strawberries, bananas, mandarin oranges)    Lunch  left -overs, ramen noodles    Snack (afternoon)  fruit with nuts  Dinner  chicken, [place], occasional fish; potatoes, peas, beans, corn, rice, pasta, green beans, broccoli, asparagus, lettuce, olives    Beverage(s)  water, diet soda - 1 x day, juice and regular soda weekly      Exercise   Exercise Type  Light (walking / raking leaves)    How many days per week to you exercise?  3    How many minutes per day do you exercise?  30    Total minutes per week of exercise  90      Patient Education   Previous Diabetes Education  No   Pt had Gestational in past with no education   Disease state   Definition of diabetes, type 1 and 2, and the diagnosis of diabetes;Factors that contribute to the development of diabetes    Nutrition management   Role of diet in the  treatment of diabetes and the relationship between the three main macronutrients and blood glucose level;Food label reading, portion sizes and measuring food.;Reviewed blood glucose goals for pre and post meals and how to evaluate the patients' food intake on their blood glucose level.    Physical activity and exercise   Role of exercise on diabetes management, blood pressure control and cardiac health.    Medications  Other (comment)   Limited use of oral medications during pregnancy and possibility of insulin - she was on insulin with one of her pregnancies   Monitoring  Taught/evaluated SMBG meter.;Purpose and frequency of SMBG.;Taught/discussed recording of test results and interpretation of SMBG.;Identified appropriate SMBG and/or A1C goals.;Ketone testing, when, how.    Chronic complications  Relationship between chronic complications and blood glucose control    Psychosocial adjustment  Role of stress on diabetes;Identified and addressed patients feelings and concerns about diabetes    Preconception care  Pregnancy and GDM  Role of pre-pregnancy blood glucose control on the development of the fetus;Reviewed with patient blood glucose goals with pregnancy;Role of family planning for patients with diabetes    Personal strategies to promote health  Review risk of smoking and offered smoking cessation      Individualized Goals (developed by patient)   Reducing Risk Improve blood sugars Prevent diabetes complications Lose weight Lead a healthier lifestyle Quit smoking     Outcomes   Expected Outcomes  Demonstrated interest in learning. Expect positive outcomes       Individualized Plan for Diabetes Self-Management Training:   Learning Objective:  Patient will have a greater understanding of diabetes self-management. Patient education plan is to attend individual and/or group sessions per assessed needs and concerns.   Plan:   Patient Instructions  Read booklet on Gestational  Diabetes Follow Gestational Meal Planning Guidelines Continue to avoid sugar sweetened drinks (soda, juice) Include 1 serving of protein and 1 serving of carbohydrate with snacks Complete a 3 Day Food Record and bring to next appointment Check blood sugars 4 x day - before breakfast and 2 hrs after every meal and record  Bring blood sugar log to all appointments Call MD for prescription for meter strips and lancets Strips  Accu-Chek Guide Lancets   Accu-Chek FastClix Purchase urine ketone strips if ordered by MD and check urine ketones every am:  If + increase bedtime snack to 1 protein and 2 carbohydrate servings Walk 20-30 minutes at least 5 x week if permitted by MD Decrease/Quit smoking  Expected Outcomes:  Demonstrated interest in learning. Expect positive outcomes  Education material provided:  Diabetes Management for Mothers-To-Be Booklet Gestational Meal  Planning Guidelines Simple Meal Plan Meter = Accu-Chek Guide Me 3 Day Food Record Goals for a Healthy Pregnancy  If problems or questions, patient to contact team via:  Sharion Settler, RN, CCM, CDE [phone]  Future DSME appointment:  October 05, 2019 with the dietitian

## 2019-09-28 NOTE — Patient Instructions (Signed)
Read booklet on Gestational Diabetes Follow Gestational Meal Planning Guidelines Continue to avoid sugar sweetened drinks (soda, juice) Include 1 serving of protein and 1 serving of carbohydrate with snacks Complete a 3 Day Food Record and bring to next appointment Check blood sugars 4 x day - before breakfast and 2 hrs after every meal and record  Bring blood sugar log to all appointments Call MD for prescription for meter strips and lancets Strips  Accu-Chek Guide Lancets   Accu-Chek FastClix Purchase urine ketone strips if ordered by MD and check urine ketones every am:  If + increase bedtime snack to 1 protein and 2 carbohydrate servings Walk 20-30 minutes at least 5 x week if permitted by MD Decrease/Quit smoking

## 2019-09-29 LAB — MONITOR DRUG PROFILE 14(MW)
Amphetamine Scrn, Ur: NEGATIVE ng/mL
BARBITURATE SCREEN URINE: NEGATIVE ng/mL
BENZODIAZEPINE SCREEN, URINE: NEGATIVE ng/mL
Buprenorphine, Urine: NEGATIVE ng/mL
CANNABINOIDS UR QL SCN: NEGATIVE ng/mL
Cocaine (Metab) Scrn, Ur: NEGATIVE ng/mL
Creatinine(Crt), U: 211.8 mg/dL (ref 20.0–300.0)
Fentanyl, Urine: NEGATIVE pg/mL
Meperidine Screen, Urine: NEGATIVE ng/mL
Methadone Screen, Urine: NEGATIVE ng/mL
OXYCODONE+OXYMORPHONE UR QL SCN: NEGATIVE ng/mL
Opiate Scrn, Ur: NEGATIVE ng/mL
Ph of Urine: 5.6 (ref 4.5–8.9)
Phencyclidine Qn, Ur: NEGATIVE ng/mL
Propoxyphene Scrn, Ur: NEGATIVE ng/mL
SPECIFIC GRAVITY: 1.021
Tramadol Screen, Urine: NEGATIVE ng/mL

## 2019-09-29 LAB — CBC+D+PLT+NLR
Basophils Absolute: 0 10*3/uL (ref 0.0–0.2)
Basos: 0 %
EOS (ABSOLUTE): 0.2 10*3/uL (ref 0.0–0.4)
Eos: 1 %
Hematocrit: 37 % (ref 34.0–46.6)
Hemoglobin: 12.8 g/dL (ref 11.1–15.9)
Immature Grans (Abs): 0.2 10*3/uL — ABNORMAL HIGH (ref 0.0–0.1)
Immature Granulocytes: 1 %
Lymphocytes Absolute: 2.4 10*3/uL (ref 0.7–3.1)
Lymphs: 16 %
MCH: 29.7 pg (ref 26.6–33.0)
MCHC: 34.6 g/dL (ref 31.5–35.7)
MCV: 86 fL (ref 79–97)
Monocytes Absolute: 0.7 10*3/uL (ref 0.1–0.9)
Monocytes: 5 %
Neut/Lymph Ratio: 4.9 ratio — ABNORMAL HIGH (ref 0.0–2.9)
Neutrophils Absolute: 11.7 10*3/uL — ABNORMAL HIGH (ref 1.4–7.0)
Neutrophils: 77 %
Platelets: 269 10*3/uL (ref 150–450)
RBC: 4.31 x10E6/uL (ref 3.77–5.28)
RDW: 12.2 % (ref 11.7–15.4)
WBC: 15.3 10*3/uL — ABNORMAL HIGH (ref 3.4–10.8)

## 2019-09-30 MED ORDER — GLUCOSE BLOOD VI STRP: ORAL_STRIP | 12 refills | Status: DC

## 2019-09-30 MED ORDER — ACCU-CHEK FASTCLIX LANCETS MISC: 1.0000 [IU] | Freq: Four times a day (QID) | 12 refills | Status: DC

## 2019-09-30 NOTE — Addendum Note (Signed)
Addended by: Brooke Dare on: 09/30/2019 02:44 PM   Modules accepted: Orders

## 2019-10-04 ENCOUNTER — Telehealth: Payer: Self-pay

## 2019-10-04 NOTE — Telephone Encounter (Signed)
Patient says she was referred to a diabetic clinic by [facility]. They gave her a prescription and told her to call [facility] to refill it. Patient needs strips and lancets. Pls call patient 

## 2019-10-04 NOTE — Telephone Encounter (Signed)
Didn't you put in script for diabetic supplies for her. I got another message for it.   Thanks  Pattricia Boss

## 2019-10-04 NOTE — Telephone Encounter (Signed)
mychart message sent to patient- diabetic supplies sent to pharmacy on 09/30/19 with a confirmation of receipt.

## 2019-10-05 ENCOUNTER — Other Ambulatory Visit: Payer: Self-pay

## 2019-10-05 ENCOUNTER — Encounter: Payer: Medicaid Other | Admitting: Skilled Nursing Facility1

## 2019-10-05 DIAGNOSIS — E1165 Type 2 diabetes mellitus with hyperglycemia: Secondary | ICD-10-CM

## 2019-10-05 DIAGNOSIS — Z713 Dietary counseling and surveillance: Secondary | ICD-10-CM | POA: Diagnosis not present

## 2019-10-05 NOTE — Progress Notes (Signed)
Pt states she more recently started medication for a UTI. Pt states she has had type 2 diabetes for about 1 year. Pt states she was not checking her blood sugar previous to conceiving and states since checking her blood sugars she has learned a lot about herself and how high her numbers run and will continue to check after giving birth.  Pt states she is about 27-[redacted] weeks along. Pt states she does currently smoke: Dietitian educated pt on why she needs to quit for the health of her baby and herself. Pt states she smokes because she is stressed mostly with the fact she has several kids to take care of with no job and her partner is unemployed.  Pt states she sees where white bread causes high blood sugars where whole wheat does not.  Pt states she usually has dinner around 6:30-7pm and in bed around 10pm-2am. Waking at 7:30am eating around 8:30am.  Pt states she has been working on cutting back on diet pepsi. Pt states she has been drinking 125 ounces of water a day.  Pt states she has been working on eating more non starchy vegetables.  Pt states she has been checking her blood sugar 4 times a day and does not use the same needle more than once.  Fasting: 127, 133, 135, 122, 107 Post prandial: 203, 180, 170, 195, 165, 175, 153  Pt states her partner is very supportive.  Pt states she has been walking about 30 minutes 3-4 days a week.  Pt really has done an excellent job with all the dietary changes she has been making and making healthy combinations with her foods. Dietitian is remise in finding where her diet is contributing to such high numbers, her diet is currently appropriate with no sugary beverages or simple carbohydrate reported and appropriate portions sizes of complex carbohydrates. Possible contributing factor could be her stress level.   Education Topics: Oral Health  Need for non starchy vegetables The importance of stress management and its roll in high blood sugars Smoking during  pregnancy and the risks it poses to baby  Smoking with type 2 diabetes and the risks it poses   Goals: Try a snack about 2 hours before bed: try just protein then protein and carbohydrate  You can cut back your water to 80-90 ounces per day Try walking after dinner Brush 2 times a day and floss daily Keep up your walks! Awesome!  Continue to check your blood sugars daily and even after giving birth  [place] job on eating your veggies!   Handouts: Things to do

## 2019-10-07 ENCOUNTER — Encounter: Payer: Medicaid Other | Admitting: Certified Nurse Midwife

## 2019-10-10 ENCOUNTER — Encounter: Payer: Self-pay | Admitting: Certified Nurse Midwife

## 2019-10-10 ENCOUNTER — Other Ambulatory Visit: Payer: Self-pay

## 2019-10-10 ENCOUNTER — Ambulatory Visit (INDEPENDENT_AMBULATORY_CARE_PROVIDER_SITE_OTHER): Payer: Medicaid Other | Admitting: Certified Nurse Midwife

## 2019-10-10 DIAGNOSIS — Z23 Encounter for immunization: Secondary | ICD-10-CM

## 2019-10-10 DIAGNOSIS — Z3480 Encounter for supervision of other normal pregnancy, unspecified trimester: Secondary | ICD-10-CM

## 2019-10-10 LAB — POCT URINALYSIS DIPSTICK OB
Bilirubin, UA: NEGATIVE
Blood, UA: NEGATIVE
Glucose, UA: NEGATIVE
Ketones, UA: NEGATIVE
Leukocytes, UA: NEGATIVE
Nitrite, UA: NEGATIVE
Spec Grav, UA: >=1.03 — AB (ref 1.010–1.025)
Urobilinogen, UA: 0.2 E.U./dL
pH, UA: 5 (ref 5.0–8.0)

## 2019-10-10 MED ORDER — HYDROCORTISONE ACETATE 25 MG RE SUPP: 25.0000 mg | Freq: Two times a day (BID) | RECTAL | 0 refills | Status: DC

## 2019-10-10 MED ORDER — TETANUS-DIPHTH-ACELL PERTUSSIS 5-2.5-18.5 LF-MCG/0.5 IM SUSP
0.5000 mL | Freq: Once | INTRAMUSCULAR | Status: AC
  Administered 2019-10-10: 0.5 mL via INTRAMUSCULAR

## 2019-10-10 MED ORDER — GLYBURIDE 5 MG PO TABS: 5.0000 mg | ORAL_TABLET | Freq: Two times a day (BID) | ORAL | 0 refills | Status: DC

## 2019-10-10 NOTE — Patient Instructions (Signed)

## 2019-10-10 NOTE — Progress Notes (Signed)
ROB doing well. Feels good movement. Discussed fetal kick counts. Diabetic log reviewed. All levels elevated. Fasings 110-140, 2 hrr pp 121-250. Dr. Marcelline Mates consulted. Start on glyburide 5 mg BID. After 3-4 days ranges continue to be elevated increase to 7.5 mg BID. Pt verbalizes understanding. She complains of some hemorrhoids that are not responding to over the counter medications. Self help measures reviewed. Order place for treatment. Discussed BC after baby , pt requesting postpartum tubal. She verbalizes understanding of alternative good bc methods states that this is her 4th and she is sure /positive she does not want any more. Consent signed. Pt to follow up 1 wks for reviewed of BS on medications.   Philip Aspen, CNM

## 2019-10-11 LAB — CBC
Hematocrit: 38.7 % (ref 34.0–46.6)
Hemoglobin: 13.3 g/dL (ref 11.1–15.9)
MCH: 29.3 pg (ref 26.6–33.0)
MCHC: 34.4 g/dL (ref 31.5–35.7)
MCV: 85 fL (ref 79–97)
Platelets: 326 10*3/uL (ref 150–450)
RBC: 4.54 x10E6/uL (ref 3.77–5.28)
RDW: 12.4 % (ref 11.7–15.4)
WBC: 14.4 10*3/uL — ABNORMAL HIGH (ref 3.4–10.8)

## 2019-10-11 LAB — RPR: RPR Ser Ql: NONREACTIVE

## 2019-10-17 ENCOUNTER — Ambulatory Visit (INDEPENDENT_AMBULATORY_CARE_PROVIDER_SITE_OTHER): Payer: Medicaid Other | Admitting: Certified Nurse Midwife

## 2019-10-17 ENCOUNTER — Other Ambulatory Visit: Payer: Self-pay

## 2019-10-17 ENCOUNTER — Encounter: Payer: Self-pay | Admitting: Certified Nurse Midwife

## 2019-10-17 VITALS — BP 126/90 | HR 122 | Wt 196.0 lb

## 2019-10-17 DIAGNOSIS — Z3480 Encounter for supervision of other normal pregnancy, unspecified trimester: Secondary | ICD-10-CM

## 2019-10-17 LAB — POCT URINALYSIS DIPSTICK OB
Bilirubin, UA: NEGATIVE
Blood, UA: NEGATIVE
Glucose, UA: NEGATIVE
Ketones, UA: NEGATIVE
Leukocytes, UA: NEGATIVE
Nitrite, UA: NEGATIVE
POC,PROTEIN,UA: NEGATIVE
Spec Grav, UA: 1.025 (ref 1.010–1.025)
Urobilinogen, UA: 0.2 E.U./dL
pH, UA: 5 (ref 5.0–8.0)

## 2019-10-17 NOTE — Progress Notes (Signed)
[facility] [employer] [facility] student spoke with patient about Ready, Set, Baby and basics on breastfeeding.

## 2019-10-17 NOTE — Patient Instructions (Signed)
Gestational Diabetes Mellitus, Self Care When you have gestational diabetes (gestational diabetes mellitus), you must make sure your blood sugar (glucose) stays in a healthy range. You can do this with:  Nutrition.  Exercise.  Lifestyle changes.  Medicines or insulin, if needed.  Support from your doctors and others. If you get treated for this condition, it may not hurt you or your unborn baby (fetus). If you do not get treated for this condition, it may cause problems that can hurt you or your unborn baby. If you get gestational diabetes, you are:  More likely to get it if you get pregnant again.  More likely to develop type 2 diabetes in the future. How to stay aware of blood sugar   Check your blood sugar every day while you are pregnant. Check it as often as told.  Call your doctor if your blood sugar is above your goal numbers for two tests in a row. Your doctor will set personal treatment goals for you. Generally, you should have these blood sugar levels:  Before meals, or after not eating for a long time (fasting or preprandial): at or below 95 mg/dL (5.3 mmol/L).  After meals (postprandial): ? One hour after a meal: at or below 140 mg/dL (7.8 mmol/L). ? Two hours after a meal: at or below 120 mg/dL (6.7 mmol/L).  A1c (hemoglobin A1c) level: 6-6.5%. How to manage high and low blood sugar Signs of high blood sugar High blood sugar is called hyperglycemia. Know the early signs of high blood sugar. Signs may include:  Feeling: ? Thirsty. ? Hungry. ? Very tired.  Needing to pee (urinate) more than usual.  Blurry vision. Signs of low blood sugar Low blood sugar is called hypoglycemia. This is when blood sugar is at or below 70 mg/dL (3.9 mmol/L). Signs may include:  Feeling: ? Hungry. ? Worried or nervous (anxious). ? Sweaty and clammy. ? Confused. ? Dizzy. ? Sleepy. ? Sick to your stomach (nauseous).  Having: ? A fast heartbeat. ? A headache. ? A change  in your vision. ? Tingling or no feeling (numbness) around your mouth, lips, or tongue. ? Jerky movements that you cannot control (seizure).  Having trouble with: ? Moving (coordination). ? Sleeping. ? Passing out (fainting). ? Getting upset easily (irritability). Treating low blood sugar To treat low blood sugar, eat or drink something sugary right away. If you can think clearly and swallow safely, follow the 15:15 rule:  Take 15 grams of a fast-acting carb (carbohydrate). Talk with your doctor about how much you should take.  Some fast-acting carbs are: ? Sugar tablets (glucose pills). Take 3-4 glucose pills. ? 6-8 pieces of hard candy. ? 4-6 oz (120-150 mL) of fruit juice. ? 4-6 oz (120-150 mL) of regular (not diet) soda. ? 1 Tbsp (15 mL) honey or sugar.  Check your blood sugar 15 minutes after you take the carb.  If your blood sugar is still at or below 70 mg/dL (3.9 mmol/L), take 15 grams of a carb again.  If your blood sugar does not go above 70 mg/dL (3.9 mmol/L) after 3 tries, get help right away.  After your blood sugar goes back to normal, eat a meal or a snack within 1 hour. Treating very low blood sugar If your blood sugar is at or below 54 mg/dL (3 mmol/L), you have very low blood sugar (severe hypoglycemia). This is an emergency. Do not wait to see if the symptoms will go away. Get medical help right  away. Call your local emergency services (911 in the [place] If you have very low blood sugar and you cannot eat or drink, you may need a glucagon shot (injection). A family member or friend should learn how to check your blood sugar and how to give you a glucagon shot. Ask your doctor if you need to have a glucagon shot kit at home. Follow these instructions at home: Medicine  Take your insulin and diabetes medicines as told.  If your doctor says you should take more or less insulin or medicines, do this exactly as told.  Do not run out of insulin or  medicines. Food   Make healthy food choices. These include: ? Chicken, fish, egg whites, and beans. ? Oats, whole wheat, bulgur, brown rice, quinoa, and millet. ? Fresh fruits and vegetables. ? Low-fat dairy products. ? Nuts, avocado, olive oil, and canola oil.  Meet with a food specialist (dietitian). He or she can help you make an eating plan that is right for you.  Follow instructions from your doctor about what you cannot eat or drink.  Drink enough fluid to keep your pee (urine) pale yellow.  Eat healthy snacks between healthy meals.  Keep track of carbs that you eat. Do this by reading food labels and learning food serving sizes.  Follow your sick day plan when you cannot eat or drink normally. Make this plan with your doctor so it is ready to use. Activity  Exercise for 30 or more minutes a day, or as much as your doctor recommends.  Talk with your doctor before you start a new exercise or activity. Your doctor may need to tell you to change: ? How much insulin or medicines you take. ? How much food you eat. Lifestyle  Do not drink alcohol.  Do not use any tobacco products. These include cigarettes, chewing tobacco, and e-cigarettes. If you need help quitting, ask your doctor.  Learn how to deal with stress. If you need help with this, ask your doctor. Body care  Stay up to date with your shots (immunizations).  Brush your teeth and gums two times a day. Floss one or more times a day.  Go to the dentist one or more times every 6 months.  Stay at a healthy weight while you are pregnant. General instructions  Take over-the-counter and prescription medicines only as told by your doctor.  Ask your doctor about risks of high blood pressure in pregnancy (preeclampsia and eclampsia).  Share your diabetes care plan with: ? Your work or school. ? People you live with.  Check your pee for ketones: ? When you are sick. ? As told by your doctor.  Carry a card or  wear jewelry that says you have diabetes.  Keep all follow-up visits as told by your doctor. This is important. Care after giving birth  Have your blood sugar checked 4-12 weeks after you give birth.  Get checked for diabetes one or more times during 3 years. Questions to ask your doctor  Do I need to meet with a diabetes educator?  Where can I find a support group for people with gestational diabetes? Where to find more information To learn more about diabetes, visit:  American Diabetes Association: www.diabetes.org  Centers for Disease Control and Prevention (CDC): www.cdc.gov Summary  Check your blood sugar (glucose) every day while you are pregnant. Check it as often as told.  Take your insulin and diabetes medicines as told.  Keep all follow-up visits as   told by your doctor. This is important.  Have your blood sugar checked 4-12 weeks after you give birth. This information is not intended to replace advice given to you by your health care provider. Make sure you discuss any questions you have with your health care provider. Document Revised: 02/22/2018 Document Reviewed: 10/05/2015 Elsevier Patient Education  2020 Elsevier Inc.  

## 2019-10-17 NOTE — Progress Notes (Signed)
ROB doing well.Feels good movement. Started glyburide 5 mg BID, here today for BS log review. Fasting BS-61-99. 2 hr pp 82-120 with 2 elevated 125, 131. Pt states that she ate a tortilla (131). Discussed low carb tortilla options. She verbalizes and agrees to plan. Discussed additonal antenatal testing u/s & NST to start at 34 wks. Encouragment given with how well she has done with her BS. BP elevated today 126/90. She denies any signs or symptoms Pre E. No edema, reflexes WNL, no clonus. Will follow up next visit in 2 wks.   Doreene Burke, CNM

## 2019-10-19 ENCOUNTER — Other Ambulatory Visit: Payer: Self-pay | Admitting: Certified Nurse Midwife

## 2019-10-19 DIAGNOSIS — Z3483 Encounter for supervision of other normal pregnancy, third trimester: Secondary | ICD-10-CM

## 2019-10-19 LAB — URINE CULTURE

## 2019-10-20 ENCOUNTER — Encounter: Payer: Self-pay | Admitting: Obstetrics and Gynecology

## 2019-10-20 ENCOUNTER — Telehealth: Payer: Self-pay | Admitting: Certified Nurse Midwife

## 2019-10-20 ENCOUNTER — Other Ambulatory Visit: Payer: Self-pay

## 2019-10-20 ENCOUNTER — Ambulatory Visit (INDEPENDENT_AMBULATORY_CARE_PROVIDER_SITE_OTHER): Payer: Medicaid Other | Admitting: Obstetrics and Gynecology

## 2019-10-20 VITALS — BP 145/94 | HR 144 | Ht 66.0 in | Wt 194.0 lb

## 2019-10-20 DIAGNOSIS — B977 Papillomavirus as the cause of diseases classified elsewhere: Secondary | ICD-10-CM

## 2019-10-20 DIAGNOSIS — R87611 Atypical squamous cells cannot exclude high grade squamous intraepithelial lesion on cytologic smear of cervix (ASC-H): Secondary | ICD-10-CM | POA: Diagnosis not present

## 2019-10-20 DIAGNOSIS — N72 Inflammatory disease of cervix uteri: Secondary | ICD-10-CM

## 2019-10-20 NOTE — Telephone Encounter (Signed)
Pt called in and stated the 4 Hemorrhoids this morning and wanted to know if she should go to the ED? Called back and pt talked to [facility]

## 2019-10-20 NOTE — Progress Notes (Signed)
Referring Provider: Philip Aspen, CNM  HPI:  Janice Kaufman is a 29 y.o.  [phone]  who presents today for evaluation and management of abnormal cervical cytology.    Dysplasia History: Pap smear 10/2018 revealed ASCUS high with positive viral type16  no follow-up since that time.  Patient currently 30 weeks estimated gestational age.  Her current complaint today is of severe painful hemorrhoids.  Patient can barely move or sit down.  She has used steroid suppositories without effect.  ROS:  Pertinent items are noted in HPI.  OB History  Gravida Para Term Preterm AB Living  5 3 3   1 3   SAB TAB Ectopic Multiple Live Births  1       3    # Outcome Date GA Lbr Len/2nd Weight Sex Delivery Anes PTL Lv  5 Current           4 Term 2018   7 lb 7 oz (3.374 kg) M Vag-Spont   LIV  3 SAB 2016 [redacted]w[redacted]d         2 Term 2013   8 lb 7 oz (3.827 kg) M Vag-Spont   LIV  1 Term 2012   6 lb 3 oz (2.807 kg) F Vag-Spont   LIV    Past Medical History:  Diagnosis Date  . Abnormal Pap smear of cervix   . Anxiety   . Depression   . Diabetes mellitus ([place])    Type 2  . History of abnormal cervical Pap smear   . History of gestational hypertension   . History of pre-eclampsia   . History of urinary tract infection     Past Surgical History:  Procedure Laterality Date  . COLPOSCOPY      SOCIAL HISTORY: Social History   Substance and Sexual Activity  Alcohol Use Not Currently   Comment: Last ETOH use one year ago (in 2019)   Social History   Substance and Sexual Activity  Drug Use Never     Family History  Problem Relation Age of Onset  . Cancer Mother   . Diabetes Mother   . Thyroid disease Mother   . Ovarian cancer Paternal Grandmother   . Diabetes Paternal Grandmother     ALLERGIES:  Patient has no known allergies.  She has a current medication list which includes the following prescription(s): accu-chek fastclix lancets, aspirin ec, glucose blood, glyburide, hydrocortisone,  metformin, multivitamin-prenatal, and terconazole.  Physical Exam: -Vitals:  BP (!) 145/94   Pulse (!) 144   Ht 5\' 6"  (1.676 m)   Wt 194 lb (88 kg)   LMP 03/18/2019 (Exact Date) Comment: irregular menses  BMI 31.31 kg/m   PROCEDURE: Colposcopy performed with 4% acetic acid after verbal consent obtained                           -Aceto-white Lesions Location(s): 1-3 o'clock.              -Biopsy performed at NONE               -ECC indicated and performed: No.     -Biopsy sites made hemostatic with pressure and Monsel's solution   -Satisfactory colposcopy: Yes.      -Evidence of Invasive cervical CA :  NO No evidence of invasive cervical cancer -biopsy not performed  ASSESSMENT:  Janice Kaufman is a 29 y.o. [id number] here for  1. Atypical squamous cells cannot exclude high grade squamous intraepithelial lesion  on cytologic smear of cervix (ASC-H)   2. High risk human papilloma virus (HPV) infection of cervix   .  PLAN: 1.  Recommend follow-up colposcopy approximately 10 to 12 weeks after delivery. 2.  Immediately referral to general surgery for possible hemorrhoidal banding or other acute treatment.  No orders of the defined types were placed in this encounter.          F/U  No follow-ups on file.  Brennan Bailey ,MD 10/20/2019,11:58 AM

## 2019-10-25 ENCOUNTER — Other Ambulatory Visit: Payer: Self-pay | Admitting: General Surgery

## 2019-10-25 MED ORDER — SODIUM CHLORIDE 0.9 % IV SOLN
1.0000 g | INTRAVENOUS | Status: AC
  Administered 2019-10-26: 1 g via INTRAVENOUS
  Filled 2019-10-25: qty 1

## 2019-10-26 ENCOUNTER — Ambulatory Visit: Payer: Medicaid Other | Admitting: Anesthesiology

## 2019-10-26 ENCOUNTER — Encounter
Admission: RE | Disposition: A | Discharge status: Home / Self Care | Payer: Self-pay | Source: Home / Self Care | Attending: General Surgery

## 2019-10-26 ENCOUNTER — Encounter: Payer: Self-pay | Admitting: General Surgery

## 2019-10-26 ENCOUNTER — Other Ambulatory Visit: Payer: Self-pay

## 2019-10-26 ENCOUNTER — Ambulatory Visit
Admission: RE | Admit: 2019-10-26 | Discharge: 2019-10-26 | Disposition: A | Discharge status: Home / Self Care | Payer: Medicaid Other | Attending: General Surgery | Admitting: General Surgery

## 2019-10-26 ENCOUNTER — Other Ambulatory Visit
Admission: RE | Admit: 2019-10-26 | Discharge: 2019-10-26 | Disposition: A | Discharge status: Home / Self Care | Payer: Medicaid Other | Source: Ambulatory Visit | Attending: General Surgery | Admitting: General Surgery

## 2019-10-26 DIAGNOSIS — Z7984 Long term (current) use of oral hypoglycemic drugs: Secondary | ICD-10-CM | POA: Diagnosis not present

## 2019-10-26 DIAGNOSIS — Z833 Family history of diabetes mellitus: Secondary | ICD-10-CM | POA: Insufficient documentation

## 2019-10-26 DIAGNOSIS — F329 Major depressive disorder, single episode, unspecified: Secondary | ICD-10-CM | POA: Insufficient documentation

## 2019-10-26 DIAGNOSIS — Z3A32 32 weeks gestation of pregnancy: Secondary | ICD-10-CM | POA: Diagnosis not present

## 2019-10-26 DIAGNOSIS — O99340 Other mental disorders complicating pregnancy, unspecified trimester: Secondary | ICD-10-CM | POA: Insufficient documentation

## 2019-10-26 DIAGNOSIS — O169 Unspecified maternal hypertension, unspecified trimester: Secondary | ICD-10-CM | POA: Insufficient documentation

## 2019-10-26 DIAGNOSIS — Z20822 Contact with and (suspected) exposure to covid-19: Secondary | ICD-10-CM | POA: Diagnosis not present

## 2019-10-26 DIAGNOSIS — O24319 Unspecified pre-existing diabetes mellitus in pregnancy, unspecified trimester: Secondary | ICD-10-CM | POA: Diagnosis not present

## 2019-10-26 DIAGNOSIS — O2243 Hemorrhoids in pregnancy, third trimester: Secondary | ICD-10-CM | POA: Insufficient documentation

## 2019-10-26 DIAGNOSIS — K219 Gastro-esophageal reflux disease without esophagitis: Secondary | ICD-10-CM | POA: Diagnosis not present

## 2019-10-26 DIAGNOSIS — Z8349 Family history of other endocrine, nutritional and metabolic diseases: Secondary | ICD-10-CM | POA: Insufficient documentation

## 2019-10-26 DIAGNOSIS — Z809 Family history of malignant neoplasm, unspecified: Secondary | ICD-10-CM | POA: Insufficient documentation

## 2019-10-26 DIAGNOSIS — Z8041 Family history of malignant neoplasm of ovary: Secondary | ICD-10-CM | POA: Diagnosis not present

## 2019-10-26 DIAGNOSIS — E119 Type 2 diabetes mellitus without complications: Secondary | ICD-10-CM | POA: Diagnosis not present

## 2019-10-26 DIAGNOSIS — Z79899 Other long term (current) drug therapy: Secondary | ICD-10-CM | POA: Insufficient documentation

## 2019-10-26 DIAGNOSIS — F17210 Nicotine dependence, cigarettes, uncomplicated: Secondary | ICD-10-CM | POA: Insufficient documentation

## 2019-10-26 DIAGNOSIS — F419 Anxiety disorder, unspecified: Secondary | ICD-10-CM | POA: Diagnosis not present

## 2019-10-26 DIAGNOSIS — O99330 Smoking (tobacco) complicating pregnancy, unspecified trimester: Secondary | ICD-10-CM | POA: Insufficient documentation

## 2019-10-26 HISTORY — PX: HEMORRHOID SURGERY: SHX153

## 2019-10-26 LAB — GLUCOSE, CAPILLARY
Glucose-Capillary: 136 mg/dL — ABNORMAL HIGH (ref 70–99)
Glucose-Capillary: 165 mg/dL — ABNORMAL HIGH (ref 70–99)

## 2019-10-26 LAB — RESPIRATORY PANEL BY RT PCR (FLU A&B, COVID)
Influenza A by PCR: NEGATIVE
Influenza B by PCR: NEGATIVE
SARS Coronavirus 2 by RT PCR: NEGATIVE

## 2019-10-26 SURGERY — HEMORRHOIDECTOMY
Anesthesia: Spinal

## 2019-10-26 MED ORDER — SOD CITRATE-CITRIC ACID 500-334 MG/5ML PO SOLN
30.0000 mL | Freq: Once | ORAL | Status: AC
  Administered 2019-10-26: 11:00:00 30 mL via ORAL
  Filled 2019-10-26: qty 30

## 2019-10-26 MED ORDER — FAMOTIDINE 20 MG PO TABS: 20.0000 mg | ORAL_TABLET | Freq: Once | ORAL | Status: AC

## 2019-10-26 MED ORDER — HYDROCODONE-ACETAMINOPHEN 5-325 MG PO TABS: 1.0000 | ORAL_TABLET | ORAL | 0 refills | Status: DC | PRN

## 2019-10-26 MED ORDER — FAMOTIDINE 20 MG PO TABS
ORAL_TABLET | ORAL | Status: AC
  Administered 2019-10-26: 09:00:00 20 mg via ORAL
  Filled 2019-10-26: qty 1

## 2019-10-26 MED ORDER — FENTANYL CITRATE (PF) 100 MCG/2ML IJ SOLN: 25.0000 ug | INTRAMUSCULAR | Status: DC | PRN

## 2019-10-26 MED ORDER — BUPIVACAINE IN DEXTROSE 0.75-8.25 % IT SOLN
INTRATHECAL | Status: DC | PRN
  Administered 2019-10-26: 1 mL via INTRATHECAL

## 2019-10-26 MED ORDER — MIDAZOLAM HCL 2 MG/2ML IJ SOLN
INTRAMUSCULAR | Status: AC
  Filled 2019-10-26: qty 2

## 2019-10-26 MED ORDER — EPINEPHRINE PF 1 MG/ML IJ SOLN
INTRAMUSCULAR | Status: AC
  Filled 2019-10-26: qty 1

## 2019-10-26 MED ORDER — SOD CITRATE-CITRIC ACID 500-334 MG/5ML PO SOLN
ORAL | Status: AC
  Filled 2019-10-26: qty 15

## 2019-10-26 MED ORDER — SODIUM CHLORIDE 0.9 % IV SOLN: INTRAVENOUS | Status: DC

## 2019-10-26 MED ORDER — LIDOCAINE HCL (PF) 2 % IJ SOLN
INTRAMUSCULAR | Status: AC
  Filled 2019-10-26: qty 10

## 2019-10-26 MED ORDER — PROPOFOL 10 MG/ML IV BOLUS
INTRAVENOUS | Status: AC
  Filled 2019-10-26: qty 20

## 2019-10-26 MED ORDER — BUPIVACAINE HCL (PF) 0.5 % IJ SOLN
INTRAMUSCULAR | Status: AC
  Filled 2019-10-26: qty 30

## 2019-10-26 MED ORDER — MIDAZOLAM HCL 5 MG/5ML IJ SOLN
INTRAMUSCULAR | Status: DC | PRN
  Administered 2019-10-26: .5 mg via INTRAVENOUS

## 2019-10-26 MED ORDER — BUPIVACAINE LIPOSOME 1.3 % IJ SUSP
INTRAMUSCULAR | Status: AC
  Filled 2019-10-26: qty 20

## 2019-10-26 MED ORDER — GABAPENTIN 300 MG PO CAPS: 300.0000 mg | ORAL_CAPSULE | ORAL | Status: DC

## 2019-10-26 MED ORDER — FENTANYL CITRATE (PF) 100 MCG/2ML IJ SOLN
INTRAMUSCULAR | Status: AC
  Filled 2019-10-26: qty 2

## 2019-10-26 MED ORDER — LIDOCAINE HCL (PF) 1 % IJ SOLN
INTRAMUSCULAR | Status: DC | PRN
  Administered 2019-10-26: 2 mL via SUBCUTANEOUS

## 2019-10-26 MED ORDER — BUPIVACAINE-EPINEPHRINE 0.5% -1:200000 IJ SOLN
INTRAMUSCULAR | Status: DC | PRN
  Administered 2019-10-26: 30 mL

## 2019-10-26 MED ORDER — MIDAZOLAM HCL 5 MG/5ML IJ SOLN: INTRAMUSCULAR | Status: DC | PRN

## 2019-10-26 SURGICAL SUPPLY — 30 items
BLADE PHOTON ILLUMINATED (MISCELLANEOUS) ×3 IMPLANT
BLADE SURG 15 STRL SS SAFETY (BLADE) ×3 IMPLANT
BRIEF STRETCH MATERNITY 2XLG (MISCELLANEOUS) ×3 IMPLANT
CANISTER SUCT 1200ML W/VALVE (MISCELLANEOUS) ×3 IMPLANT
COVER WAND RF STERILE (DRAPES) ×3 IMPLANT
DRAPE LAPAROTOMY 100X77 ABD (DRAPES) ×3 IMPLANT
DRAPE LEGGINS SURG 28X43 STRL (DRAPES) ×3 IMPLANT
DRAPE UNDER BUTTOCK W/FLU (DRAPES) ×3 IMPLANT
DRSG GAUZE PETRO 6X36 STRIP ST (GAUZE/BANDAGES/DRESSINGS) ×3 IMPLANT
ELECT REM PT RETURN 9FT ADLT (ELECTROSURGICAL) ×3
ELECTRODE REM PT RTRN 9FT ADLT (ELECTROSURGICAL) ×1 IMPLANT
GLOVE BIO SURGEON STRL SZ7.5 (GLOVE) ×3 IMPLANT
GLOVE INDICATOR 8.0 STRL GRN (GLOVE) ×3 IMPLANT
GOWN STRL REUS W/ TWL LRG LVL3 (GOWN DISPOSABLE) ×2 IMPLANT
GOWN STRL REUS W/TWL LRG LVL3 (GOWN DISPOSABLE) ×4
KIT TURNOVER KIT A (KITS) ×3 IMPLANT
LABEL OR SOLS (LABEL) IMPLANT
NEEDLE HYPO 22GX1.5 SAFETY (NEEDLE) ×3 IMPLANT
NEEDLE HYPO 25X1 1.5 SAFETY (NEEDLE) ×3 IMPLANT
PACK BASIN MINOR ARMC (MISCELLANEOUS) ×3 IMPLANT
PAD OB MATERNITY 4.3X12.25 (PERSONAL CARE ITEMS) ×3 IMPLANT
PAD PREP 24X41 OB/GYN DISP (PERSONAL CARE ITEMS) ×3 IMPLANT
SHEARS FOC LG CVD HARMONIC 17C (MISCELLANEOUS) IMPLANT
SHEARS HARMONIC 9CM CVD (BLADE) ×3 IMPLANT
SURGILUBE 2OZ TUBE FLIPTOP (MISCELLANEOUS) ×3 IMPLANT
SUT CHROMIC 3 0 SH 27 (SUTURE) ×9 IMPLANT
SUT SILK 0 CT 1 30 (SUTURE) ×3 IMPLANT
SUT VIC AB 3-0 SH 27 (SUTURE)
SUT VIC AB 3-0 SH 27X BRD (SUTURE) IMPLANT
SYR 10ML LL (SYRINGE) ×6 IMPLANT

## 2019-10-26 NOTE — H&P (Signed)
No change from yesterday's exam. Stage IV hemorrhoids.  For hemorrhoidectomy.

## 2019-10-26 NOTE — Progress Notes (Signed)
Labor and delivery RN to PACU to do post-op NST. Category 1 tracing with  15x15 accelerations, no decelerations. No contraction noted during NST. Will send fetal heart rate strip to medical records to be scanned in.

## 2019-10-26 NOTE — Anesthesia Procedure Notes (Signed)
Spinal  Patient location during procedure: OR Start time: 10/26/2019 11:01 AM End time: 10/26/2019 11:04 AM Staffing Performed: resident/CRNA  Anesthesiologist: Emmie Niemann, MD Resident/CRNA: Jonna Clark, CRNA Preanesthetic Checklist Completed: patient identified, IV checked, site marked, risks and benefits discussed, surgical consent, monitors and equipment checked, pre-op evaluation and timeout performed Spinal Block Patient position: sitting Prep: ChloraPrep Patient monitoring: heart rate, continuous pulse ox and blood pressure Approach: midline Location: L4-5 Injection technique: single-shot Needle Needle type: Introducer and Pencil-Tip  Needle gauge: 24 G Needle length: 9 cm Additional Notes Negative paresthesia. Negative blood return. Positive free-flowing CSF. Expiration date of kit checked and confirmed. Patient tolerated procedure well, without complications.

## 2019-10-26 NOTE — Op Note (Signed)
Preoperative diagnosis: Thrombosed external hemorrhoids.  Postoperative diagnosis: Same.  Operative procedure Ferguson hemorrhoidectomy.  Operating surgeon: Donnalee Curry, MD.  Anesthesia: Spinal, Marcaine 0.5% with 1: 200,000 units of epinephrine, 30 cc.  Clinical note: This 29 year old woman who is 30-[redacted] weeks pregnant has developed severe stage IV hemorrhoids.  8 trial of steroids were unsuccessful to relieve her symptoms.  She is brought to the operating room for hemorrhoidectomy.  She received Invanz prior to the procedure.  Operative note: The patient underwent spinal anesthesia and tolerated this well.  She was placed in bumblebee stirrups and the perineum cleansed with Betadine solution draped.  Marcaine with epinephrine was instilled for postoperative analgesia.  The 3 hemorrhoidal complexes at the 5, 7 and 10 o'clock position were identified.  The superior aspect was controlled with a 3-0 chromic suture and this was then used for wound closure.  The harmonic scalpel was used to excise the hemorrhoids.  At the 5-7 o'clock position the clot was expressed from between the 2 columns and a 8 mm bridge of skin was preserved.  Excellent hemostasis was noted.  A running locking 3-0 chromic suture was used at each of the 3 sites.  At the end of the procedure a Vaseline gauze pack was placed for removal in the recovery room.  The patient was transported to the recovery room in stable condition.

## 2019-10-26 NOTE — Transfer of Care (Signed)
Immediate Anesthesia Transfer of Care Note  Patient: Janice Kaufman  Procedure(s) Performed: HEMORRHOIDECTOMY (N/A )  Patient Location: PACU  Anesthesia Type:Spinal  Level of Consciousness: awake, alert  and oriented  Airway & Oxygen Therapy: Patient Spontanous Breathing  Post-op Assessment: Report given to RN and Post -op Vital signs reviewed and stable  Post vital signs: Reviewed and stable  Last Vitals:  Vitals Value Taken Time  BP 135/84 10/26/19 1207  Temp    Pulse 78 10/26/19 1208  Resp 22 10/26/19 1208  SpO2 99 % 10/26/19 1208  Vitals shown include unvalidated device data.  Last Pain:  Vitals:   10/26/19 0858  TempSrc: Temporal  PainSc: 5          Complications: No apparent anesthesia complications

## 2019-10-26 NOTE — Progress Notes (Signed)
Rectal packing removed per Dr. Lemar Livings order .

## 2019-10-26 NOTE — Progress Notes (Signed)
Patient walked and urinated. Dr. Lemar Livings was called and he was okay with her going home without him seeing her. 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

## 2019-10-26 NOTE — Anesthesia Procedure Notes (Signed)
Spinal  Patient location during procedure: OR End time: 10/26/2019 11:04 AM Staffing Performed: resident/CRNA  Anesthesiologist: Emmie Niemann, MD Resident/CRNA: Jonna Clark, CRNA Preanesthetic Checklist Completed: patient identified, IV checked, site marked, risks and benefits discussed, surgical consent, monitors and equipment checked, pre-op evaluation and timeout performed Spinal Block Patient position: sitting Prep: Betadine Patient monitoring: heart rate, continuous pulse ox, blood pressure and cardiac monitor Approach: midline Location: L4-5 Injection technique: single-shot Needle Needle type: Whitacre and Introducer  Needle gauge: 24 G Needle length: 9 cm Additional Notes Negative paresthesia. Negative blood return. Positive free-flowing CSF. Expiration date of kit checked and confirmed. Patient tolerated procedure well, without complications.

## 2019-10-26 NOTE — OR Nursing (Signed)
FHT 154 prior to surgery  per [place] RN from L&D

## 2019-10-26 NOTE — Anesthesia Postprocedure Evaluation (Signed)
Anesthesia Post Note  Patient: Janice Kaufman  Procedure(s) Performed: HEMORRHOIDECTOMY (N/A )  Patient location during evaluation: PACU Anesthesia Type: Spinal Level of consciousness: oriented and awake and alert Pain management: pain level controlled Vital Signs Assessment: post-procedure vital signs reviewed and stable Respiratory status: spontaneous breathing, respiratory function stable and nonlabored ventilation Cardiovascular status: blood pressure returned to baseline and stable Postop Assessment: no headache, no backache and spinal receding Anesthetic complications: no  post op NST performed, normal   Last Vitals:  Vitals:   10/26/19 1235 10/26/19 1300  BP: 129/63 131/63  Pulse: 85 89  Resp: (!) 25 18  Temp:  37.2 C  SpO2: 97% 98%    Last Pain:  Vitals:   10/26/19 1300  TempSrc: Temporal  PainSc: 0-No pain                 Mirtha Jain

## 2019-10-26 NOTE — Anesthesia Preprocedure Evaluation (Signed)
Anesthesia Evaluation  Patient identified by MRN, date of birth, ID band Patient awake    Reviewed: Allergy & Precautions, H&P , NPO status , Patient's Chart, lab work & pertinent test results, reviewed documented beta blocker date and time   History of Anesthesia Complications Negative for: history of anesthetic complications  Airway Mallampati: III  TM Distance: >3 FB Neck ROM: full    Dental  (+) Dental Advidsory Given, Teeth Intact, Missing   Pulmonary neg shortness of breath, neg recent URI, Current Smoker,    Pulmonary exam normal        Cardiovascular Exercise Tolerance: Good negative cardio ROS Normal cardiovascular exam     Neuro/Psych PSYCHIATRIC DISORDERS Anxiety Depression negative neurological ROS     GI/Hepatic Neg liver ROS, GERD  ,  Endo/Other  diabetes  Renal/GU negative Renal ROS  negative genitourinary   Musculoskeletal   Abdominal   Peds  Hematology negative hematology ROS (+)   Anesthesia Other Findings Past Medical History: No date: Abnormal Pap smear of cervix No date: Anxiety No date: Depression No date: Diabetes mellitus ([facility])     Comment:  Type 2 No date: History of abnormal cervical Pap smear No date: History of gestational hypertension No date: History of pre-eclampsia No date: History of urinary tract infection   Reproductive/Obstetrics (+) Pregnancy                             Anesthesia Physical Anesthesia Plan  ASA: III  Anesthesia Plan: Spinal   Post-op Pain Management:    Induction:   PONV Risk Score and Plan:   Airway Management Planned: Natural Airway and Simple Face Mask  Additional Equipment:   Intra-op Plan:   Post-operative Plan:   Informed Consent: I have reviewed the patients History and Physical, chart, labs and discussed the procedure including the risks, benefits and alternatives for the proposed anesthesia with the  patient or authorized representative who has indicated his/her understanding and acceptance.     Dental Advisory Given  Plan Discussed with: Anesthesiologist, CRNA and Surgeon  Anesthesia Plan Comments:         Anesthesia Quick Evaluation

## 2019-10-26 NOTE — Discharge Instructions (Signed)

## 2019-10-27 ENCOUNTER — Other Ambulatory Visit: Payer: Self-pay | Admitting: General Surgery

## 2019-10-27 LAB — SURGICAL PATHOLOGY

## 2019-10-31 ENCOUNTER — Other Ambulatory Visit: Payer: Self-pay

## 2019-10-31 ENCOUNTER — Encounter: Payer: BLUE CROSS/BLUE SHIELD | Admitting: Certified Nurse Midwife

## 2019-10-31 ENCOUNTER — Ambulatory Visit (INDEPENDENT_AMBULATORY_CARE_PROVIDER_SITE_OTHER): Payer: Medicaid Other | Admitting: Certified Nurse Midwife

## 2019-10-31 VITALS — BP 134/90 | HR 117 | Wt 189.2 lb

## 2019-10-31 DIAGNOSIS — Z3483 Encounter for supervision of other normal pregnancy, third trimester: Secondary | ICD-10-CM

## 2019-10-31 LAB — POCT URINALYSIS DIPSTICK OB
Bilirubin, UA: NEGATIVE
Blood, UA: NEGATIVE
Glucose, UA: NEGATIVE
Ketones, UA: NEGATIVE
Leukocytes, UA: NEGATIVE
Nitrite, UA: NEGATIVE
POC,PROTEIN,UA: NEGATIVE
Spec Grav, UA: 1.025 (ref 1.010–1.025)
Urobilinogen, UA: 0.2 E.U./dL
pH, UA: 5 (ref 5.0–8.0)

## 2019-10-31 NOTE — Lactation Note (Signed)
Lactation Consultation Note  Patient Name: Adine Heimann [id number] Date: 10/31/2019   Lactation student discussed benefits of breastfeeding per the Ready, Set, Baby curriculum. Shakyra Mitchum encouraged to review breastfeeding information on Ready, set, [employer] site and given information for virtual breastfeeding classes.   Maternal Data    Feeding    LATCH Score                   Interventions    Lactation Tools Discussed/Used     Consult Status      Cornelious Bryant 10/31/2019, 10:42 AM

## 2019-10-31 NOTE — Patient Instructions (Signed)

## 2019-10-31 NOTE — Addendum Note (Signed)
Addended by: Smith Mince on: 10/31/2019 10:51 AM   Modules accepted: Orders

## 2019-10-31 NOTE — Progress Notes (Signed)
ROB doing well. Had surgery on her hemorrhoids on 10th.  State she has some mild discomfort 5/10 pain , taking tylenol. She states she popped a stitch, deneis any active bleeding or signs of infection. Pt instructed to follow up with Dr. Doristine Counter as scheduled. BP elevated today. 134/90- baseline labs today. Discussed S&S of pre E, she denies any symptoms. BS log reviewed. She Fasting BS less than 100 ( 70-95), had 3 elevated 2 hr pp 125, 130, 200, states it was what she ate, mashed potatos, tortilla. She will have BPP/growth scan and NST at next appointment due to diabetes and gestational hypertension. PT verbalizes and agrees to plan. Pt asked to bring meter to next appointment to verify that sugars are correct.   Doreene Burke, CNM

## 2019-11-01 LAB — COMPREHENSIVE METABOLIC PANEL
ALT: 8 IU/L (ref 0–32)
AST: 11 IU/L (ref 0–40)
Albumin/Globulin Ratio: 1.4 (ref 1.2–2.2)
Albumin: 3.4 g/dL — ABNORMAL LOW (ref 3.9–5.0)
Alkaline Phosphatase: 107 IU/L (ref 39–117)
BUN/Creatinine Ratio: 12 (ref 9–23)
BUN: 7 mg/dL (ref 6–20)
Bilirubin Total: 0.3 mg/dL (ref 0.0–1.2)
CO2: 20 mmol/L (ref 20–29)
Calcium: 9.6 mg/dL (ref 8.7–10.2)
Chloride: 100 mmol/L (ref 96–106)
Creatinine, Ser: 0.59 mg/dL (ref 0.57–1.00)
GFR calc Af Amer: 146 mL/min/{1.73_m2} (ref >59)
GFR calc non Af Amer: 127 mL/min/{1.73_m2} (ref >59)
Globulin, Total: 2.4 g/dL (ref 1.5–4.5)
Glucose: 168 mg/dL — ABNORMAL HIGH (ref 65–99)
Potassium: 3.8 mmol/L (ref 3.5–5.2)
Sodium: 137 mmol/L (ref 134–144)
Total Protein: 5.8 g/dL — ABNORMAL LOW (ref 6.0–8.5)

## 2019-11-01 LAB — PROTEIN / CREATININE RATIO, URINE
Creatinine, Urine: 133.9 mg/dL
Protein, Ur: 29.3 mg/dL
Protein/Creat Ratio: 219 mg/g creat — ABNORMAL HIGH (ref 0–200)

## 2019-11-06 ENCOUNTER — Other Ambulatory Visit: Payer: Self-pay

## 2019-11-06 ENCOUNTER — Observation Stay
Admission: EM | Admit: 2019-11-06 | Discharge: 2019-11-07 | Disposition: A | Discharge status: Home / Self Care | Payer: Medicaid Other | Attending: Certified Nurse Midwife | Admitting: Certified Nurse Midwife

## 2019-11-06 ENCOUNTER — Observation Stay: Payer: Medicaid Other

## 2019-11-06 ENCOUNTER — Encounter: Payer: Self-pay | Admitting: Obstetrics and Gynecology

## 2019-11-06 DIAGNOSIS — O99333 Smoking (tobacco) complicating pregnancy, third trimester: Secondary | ICD-10-CM | POA: Diagnosis not present

## 2019-11-06 DIAGNOSIS — F17210 Nicotine dependence, cigarettes, uncomplicated: Secondary | ICD-10-CM | POA: Diagnosis not present

## 2019-11-06 DIAGNOSIS — O24415 Gestational diabetes mellitus in pregnancy, controlled by oral hypoglycemic drugs: Secondary | ICD-10-CM | POA: Insufficient documentation

## 2019-11-06 DIAGNOSIS — O4693 Antepartum hemorrhage, unspecified, third trimester: Principal | ICD-10-CM | POA: Insufficient documentation

## 2019-11-06 DIAGNOSIS — O133 Gestational [pregnancy-induced] hypertension without significant proteinuria, third trimester: Secondary | ICD-10-CM | POA: Diagnosis not present

## 2019-11-06 DIAGNOSIS — Z7984 Long term (current) use of oral hypoglycemic drugs: Secondary | ICD-10-CM | POA: Insufficient documentation

## 2019-11-06 DIAGNOSIS — Z7982 Long term (current) use of aspirin: Secondary | ICD-10-CM | POA: Diagnosis not present

## 2019-11-06 DIAGNOSIS — Z3A32 32 weeks gestation of pregnancy: Secondary | ICD-10-CM | POA: Insufficient documentation

## 2019-11-06 DIAGNOSIS — Z20822 Contact with and (suspected) exposure to covid-19: Secondary | ICD-10-CM | POA: Insufficient documentation

## 2019-11-06 DIAGNOSIS — N939 Abnormal uterine and vaginal bleeding, unspecified: Secondary | ICD-10-CM

## 2019-11-06 LAB — ABO/RH: ABO/RH(D): O POS

## 2019-11-06 LAB — URINE DRUG SCREEN, QUALITATIVE (ARMC ONLY)
Amphetamines, Ur Screen: NOT DETECTED
Barbiturates, Ur Screen: NOT DETECTED
Benzodiazepine, Ur Scrn: NOT DETECTED
Cannabinoid 50 Ng, Ur ~~LOC~~: NOT DETECTED
Cocaine Metabolite,Ur ~~LOC~~: NOT DETECTED
MDMA (Ecstasy)Ur Screen: NOT DETECTED
Methadone Scn, Ur: NOT DETECTED
Opiate, Ur Screen: NOT DETECTED
Phencyclidine (PCP) Ur S: NOT DETECTED
Tricyclic, Ur Screen: NOT DETECTED

## 2019-11-06 LAB — COMPREHENSIVE METABOLIC PANEL
ALT: 8 U/L (ref 0–44)
AST: 12 U/L — ABNORMAL LOW (ref 15–41)
Albumin: 2.9 g/dL — ABNORMAL LOW (ref 3.5–5.0)
Alkaline Phosphatase: 87 U/L (ref 38–126)
Anion gap: 10 (ref 5–15)
BUN: 9 mg/dL (ref 6–20)
CO2: 22 mmol/L (ref 22–32)
Calcium: 9.6 mg/dL (ref 8.9–10.3)
Chloride: 105 mmol/L (ref 98–111)
Creatinine, Ser: 0.5 mg/dL (ref 0.44–1.00)
GFR calc Af Amer: >60 mL/min (ref >60)
GFR calc non Af Amer: >60 mL/min (ref >60)
Glucose, Bld: 94 mg/dL (ref 70–99)
Potassium: 3.8 mmol/L (ref 3.5–5.1)
Sodium: 137 mmol/L (ref 135–145)
Total Bilirubin: 0.6 mg/dL (ref 0.3–1.2)
Total Protein: 6.2 g/dL — ABNORMAL LOW (ref 6.5–8.1)

## 2019-11-06 LAB — CBC WITH DIFFERENTIAL/PLATELET
Abs Immature Granulocytes: 0.13 10*3/uL — ABNORMAL HIGH (ref 0.00–0.07)
Basophils Absolute: 0 10*3/uL (ref 0.0–0.1)
Basophils Relative: 0 %
Eosinophils Absolute: 0.1 10*3/uL (ref 0.0–0.5)
Eosinophils Relative: 1 %
HCT: 37.1 % (ref 36.0–46.0)
Hemoglobin: 13.1 g/dL (ref 12.0–15.0)
Immature Granulocytes: 1 %
Lymphocytes Relative: 19 %
Lymphs Abs: 2.5 10*3/uL (ref 0.7–4.0)
MCH: 29.4 pg (ref 26.0–34.0)
MCHC: 35.3 g/dL (ref 30.0–36.0)
MCV: 83.4 fL (ref 80.0–100.0)
Monocytes Absolute: 0.6 10*3/uL (ref 0.1–1.0)
Monocytes Relative: 5 %
Neutro Abs: 9.5 10*3/uL — ABNORMAL HIGH (ref 1.7–7.7)
Neutrophils Relative %: 74 %
Platelets: 265 10*3/uL (ref 150–400)
RBC: 4.45 MIL/uL (ref 3.87–5.11)
RDW: 12.9 % (ref 11.5–15.5)
WBC: 12.9 10*3/uL — ABNORMAL HIGH (ref 4.0–10.5)
nRBC: 0 % (ref 0.0–0.2)

## 2019-11-06 LAB — URINALYSIS, ROUTINE W REFLEX MICROSCOPIC
Bacteria, UA: NONE SEEN
Bilirubin Urine: NEGATIVE
Glucose, UA: NEGATIVE mg/dL
Ketones, ur: 20 mg/dL — AB
Leukocytes,Ua: NEGATIVE
Nitrite: NEGATIVE
Protein, ur: NEGATIVE mg/dL
Specific Gravity, Urine: 1.012 (ref 1.005–1.030)
pH: 6 (ref 5.0–8.0)

## 2019-11-06 LAB — GLUCOSE, CAPILLARY: Glucose-Capillary: 187 mg/dL — ABNORMAL HIGH (ref 70–99)

## 2019-11-06 LAB — SAMPLE TO BLOOD BANK

## 2019-11-06 LAB — PROTEIN / CREATININE RATIO, URINE
Creatinine, Urine: 62 mg/dL
Protein Creatinine Ratio: 0.19 mg/mg{Cre} — ABNORMAL HIGH (ref 0.00–0.15)
Total Protein, Urine: 12 mg/dL

## 2019-11-06 LAB — TYPE AND SCREEN
ABO/RH(D): O POS
Antibody Screen: NEGATIVE

## 2019-11-06 LAB — RESPIRATORY PANEL BY RT PCR (FLU A&B, COVID)
Influenza A by PCR: NEGATIVE
Influenza B by PCR: NEGATIVE
SARS Coronavirus 2 by RT PCR: NEGATIVE

## 2019-11-06 MED ORDER — PRENATAL MULTIVITAMIN CH
1.0000 | ORAL_TABLET | Freq: Every day | ORAL | Status: DC
  Administered 2019-11-07: 14:00:00 1 via ORAL
  Filled 2019-11-06: qty 1

## 2019-11-06 MED ORDER — ZOLPIDEM TARTRATE 5 MG PO TABS
5.0000 mg | ORAL_TABLET | Freq: Every evening | ORAL | Status: DC | PRN
  Administered 2019-11-06: 5 mg via ORAL
  Filled 2019-11-06: qty 1

## 2019-11-06 MED ORDER — NIFEDIPINE 10 MG PO CAPS
10.0000 mg | ORAL_CAPSULE | Freq: Three times a day (TID) | ORAL | Status: DC
  Administered 2019-11-06 – 2019-11-07 (×3): 10 mg via ORAL
  Filled 2019-11-06 (×6): qty 1

## 2019-11-06 MED ORDER — DOCUSATE SODIUM 100 MG PO CAPS
100.0000 mg | ORAL_CAPSULE | Freq: Every day | ORAL | Status: DC
  Administered 2019-11-07: 100 mg via ORAL
  Filled 2019-11-06: qty 1

## 2019-11-06 MED ORDER — NIFEDIPINE 10 MG PO CAPS
20.0000 mg | ORAL_CAPSULE | Freq: Once | ORAL | Status: AC
  Administered 2019-11-06: 20 mg via ORAL
  Filled 2019-11-06: qty 2

## 2019-11-06 MED ORDER — BETAMETHASONE SOD PHOS & ACET 6 (3-3) MG/ML IJ SUSP
INTRAMUSCULAR | Status: AC
  Filled 2019-11-06: qty 5

## 2019-11-06 MED ORDER — ACETAMINOPHEN 325 MG PO TABS: 650.0000 mg | ORAL_TABLET | ORAL | Status: DC | PRN

## 2019-11-06 MED ORDER — BETAMETHASONE SOD PHOS & ACET 6 (3-3) MG/ML IJ SUSP
12.0000 mg | INTRAMUSCULAR | Status: AC
  Administered 2019-11-06 – 2019-11-07 (×2): 12 mg via INTRAMUSCULAR

## 2019-11-06 MED ORDER — GLYBURIDE 5 MG PO TABS
5.0000 mg | ORAL_TABLET | Freq: Two times a day (BID) | ORAL | Status: DC
  Administered 2019-11-06 – 2019-11-07 (×2): 5 mg via ORAL
  Filled 2019-11-06 (×2): qty 1

## 2019-11-06 MED ORDER — CALCIUM CARBONATE ANTACID 500 MG PO CHEW: 2.0000 | CHEWABLE_TABLET | ORAL | Status: DC | PRN

## 2019-11-06 NOTE — Progress Notes (Addendum)
ANTEPARTUM ADMISSION HISTORY AND PHYSICAL NOTE   History of Present Illness: Janice Kaufman is a 29 y.o. [id number] at [redacted]w[redacted]d admitted for observation due to vaginal bleeding in third trimester. Patient reports the fetal movement as active. Patient reports uterine contraction  activity as irregular, every 6-10 minutes. Patient reports  vaginal bleeding as flow about like a period. Patient describes fluid per vagina as None. Fetal presentation is unsure.  Patient Active Problem List   Diagnosis Date Noted  . Labor and delivery, indication for care 11/06/2019  . Smoker 09/23/2019  . History of gestational hypertension 09/23/2019  . History of gestational diabetes 09/23/2019  . History of pre-eclampsia 09/23/2019  . Type 2 diabetes mellitus with hyperglycemia, without long-term current use of insulin (HCC) 11/16/2018    Past Medical History:  Diagnosis Date  . Abnormal Pap smear of cervix   . Anxiety   . Depression   . Diabetes mellitus (HCC)    Type 2  . History of abnormal cervical Pap smear   . History of gestational hypertension   . History of pre-eclampsia   . History of urinary tract infection     Past Surgical History:  Procedure Laterality Date  . COLPOSCOPY    . HEMORRHOID SURGERY N/A 10/26/2019   Procedure: HEMORRHOIDECTOMY;  Surgeon: Earline Mayotte, MD;  Location: [facility] ORS;  Service: General;  Laterality: N/A;    OB History  Gravida Para Term Preterm AB Living  5 3 3   1 3   SAB TAB Ectopic Multiple Live Births  1       3    # Outcome Date GA Lbr Len/2nd Weight Sex Delivery Anes PTL Lv  5 Current           4 Term 2018   3374 g M Vag-Spont   LIV  3 SAB 2016 [redacted]w[redacted]d         2 Term 2013   3827 g M Vag-Spont   LIV  1 Term 2012   2807 g F Vag-Spont   LIV    Social History   Socioeconomic History  . Marital status: Legally Separated    Spouse name: Not on file  . Number of children: Not on file  . Years of education: Not on file  . Highest education  level: Not on file  Occupational History  . Not on file  Tobacco Use  . Smoking status: Current Every Day Smoker    Packs/day: 0.50    Years: 10.00    Pack years: 5.00    Types: Cigarettes  . Smokeless tobacco: Never Used  Substance and Sexual Activity  . Alcohol use: Not Currently    Comment: Last ETOH use one year ago (in 2019)  . Drug use: Never  . Sexual activity: Yes    Birth control/protection: None    Comment: Last BCM (ocps) used 04/2019  Other Topics Concern  . Not on file  Social History Narrative  . Not on file   Social Determinants of Health   Financial Resource Strain:   . Difficulty of Paying Living Expenses: Not on file  Food Insecurity:   . Worried About 05/2019 in the Last Year: Not on file  . Ran Out of Food in the Last Year: Not on file  Transportation Needs:   . Lack of Transportation (Medical): Not on file  . Lack of Transportation (Non-Medical): Not on file  Physical Activity:   . Days of Exercise per Week: Not on file  .  Minutes of Exercise per Session: Not on file  Stress:   . Feeling of Stress : Not on file  Social Connections:   . Frequency of Communication with Friends and Family: Not on file  . Frequency of Social Gatherings with Friends and Family: Not on file  . Attends Religious Services: Not on file  . Active Member of Clubs or Organizations: Not on file  . Attends Archivist Meetings: Not on file  . Marital Status: Not on file    Family History  Problem Relation Age of Onset  . Cancer Mother   . Diabetes Mother   . Thyroid disease Mother   . Ovarian cancer Paternal Grandmother   . Diabetes Paternal Grandmother     No Known Allergies  Medications Prior to Admission  Medication Sig Dispense Refill Last Dose  . Accu-Chek FastClix Lancets MISC 1 Units by Percutaneous route 4 (four) times daily. Test blood sugar 4 times daily. Before breakfast and 2 hours after every meal. 100 each 12   . aspirin EC 81 MG  tablet Take 1 tablet (81 mg total) by mouth daily. 30 tablet 3   . docusate sodium (COLACE) 100 MG capsule Take 100 mg by mouth 2 (two) times daily as needed for mild constipation.     Marland Kitchen glucose blood test strip Test blood sugar 4 times daily. Before breakfast,2 hours after every meal. 100 each 12   . glyBURIDE (DIABETA) 5 MG tablet Take 1 tablet (5 mg total) by mouth 2 (two) times daily with a meal. 60 tablet 0   . HYDROcodone-acetaminophen (NORCO/VICODIN) 5-325 MG tablet Take 1 tablet by mouth every 4 (four) hours as needed for moderate pain. (Patient not taking: Reported on 10/31/2019) 20 tablet 0   . Prenatal Vit-Fe Fumarate-FA (MULTIVITAMIN-PRENATAL) 27-0.8 MG TABS tablet Take 1 tablet by mouth daily.        Review of Systems - Negative except asmentioned in HPI  Vitals:  BP (!) 139/96 (BP Location: Left Arm)   Pulse 86   Temp 99.1 F (37.3 C) (Oral)   Resp 12   Ht 5\' 6"  (1.676 m)   Wt 85.7 kg   LMP 03/18/2019 (Exact Date) Comment: irregular menses  BMI 30.51 kg/m  Physical Examination: CONSTITUTIONAL: Well-developed, well-nourished female in no acute distress.  HENT:  Normocephalic, atraumatic, External right and left ear normal. Oropharynx is clear and moist EYES: Conjunctivae and EOM are normal. Pupils are equal, round, and reactive to light. No scleral icterus.  NECK: Normal range of motion, supple, no masses SKIN: Skin is warm and dry. No rash noted. Not diaphoretic. No erythema. No pallor. [place]: Alert and oriented to person, place, and time. Normal reflexes, muscle tone coordination. No cranial nerve deficit noted. PSYCHIATRIC: Normal mood and affect. Normal behavior. Normal judgment and thought content. CARDIOVASCULAR: Normal heart rate noted, regular rhythm RESPIRATORY: Effort and breath sounds normal, no problems with respiration noted ABDOMEN: Soft, nontender, nondistended, gravid. MUSCULOSKELETAL: Normal range of motion. No edema and no tenderness. 2+ distal  pulses.  Cervix: Not evaluated. and Evaluated by digital exam. and found to be 3 cm/ 50%/Floating and fetal presentation is unsure. Membranes:intact Fetal Monitoring:Baseline: 140 bpm, Variability: Good {> 6 bpm), Accelerations: Reactive and Decelerations: Absent Tocometer: irregular 6-10 hr, mild per pt.  Labs:  Results for orders placed or performed during the hospital encounter of 11/06/19 (from the past 24 hour(s))  Sample to Blood Bank   Collection Time: 11/06/19 12:41 PM  Result Value Ref Range  Blood Bank Specimen SAMPLE AVAILABLE FOR TESTING    Sample Expiration      11/09/2019,2359 Performed at [facility], [address] [place], [place] [address]     Imaging Studies: No results found.   Assessment and Plan: Patient Active Problem List   Diagnosis Date Noted  . Labor and delivery, indication for care 11/06/2019  . Smoker 09/23/2019  . History of gestational hypertension 09/23/2019  . History of gestational diabetes 09/23/2019  . History of pre-eclampsia 09/23/2019  . Type 2 diabetes mellitus with hyperglycemia, without long-term current use of insulin (HCC) 11/16/2018   Admit to Antenatal Betamethasone x 2 doses Will recheck presentation if she does progress in preterm labor to determine route of delivery Routine antenatal care Dr. Logan Bores consulted on plan of care.   Doreene Burke, CNM

## 2019-11-06 NOTE — Progress Notes (Signed)
10 min period of sharp variable decelerations. Pt not feeling any additional discomfort or bleeding.

## 2019-11-06 NOTE — Progress Notes (Signed)
[facility] COMPREHENSIVE PROGRESS NOTE  Janice Kaufman is a 29 y.o. [id number] at [redacted]w[redacted]d who is admitted for vaginal bleeding.  Estimated Date of Delivery: 12/30/19 Fetal presentation is cephalic.  Length of Stay:  0 Days. Admitted 11/06/2019  Subjective: Pt is feeling contraction q 5-8 min. She is breathing with them.  Patient reports good fetal movement.  Vitals:  Blood pressure (!) 139/96, pulse 86, temperature 99.1 F (37.3 C), temperature source Oral, resp. rate 12, height 5\' 6"  (1.676 m), weight 85.7 kg, last menstrual period 03/18/2019. ABDOMEN: Soft, nontender, nondistended, gravid. CERVIX: Dilation: 3 Effacement (%): 50 Exam by:: Philip Aspen, CNM  Fetal monitoring: FHR: 135 bpm, Variability: moderate, Accelerations: Present, Decelerations: Absent  Uterine activity: q 5-8 min. Mild-moderate  Results for orders placed or performed during the hospital encounter of 11/06/19 (from the past 48 hour(s))  Respiratory Panel by RT PCR (Flu A&B, Covid) - Nasopharyngeal Swab     Status: None   Collection Time: 11/06/19 12:41 PM   Specimen: Nasopharyngeal Swab  Result Value Ref Range   SARS Coronavirus 2 by RT PCR NEGATIVE NEGATIVE    Comment: (NOTE) SARS-CoV-2 target nucleic acids are NOT DETECTED. The SARS-CoV-2 RNA is generally detectable in upper respiratoy specimens during the acute phase of infection. The lowest concentration of SARS-CoV-2 viral copies this assay can detect is 131 copies/mL. A negative result does not preclude SARS-Cov-2 infection and should not be used as the sole basis for treatment or other patient management decisions. A negative result may occur with  improper specimen collection/handling, submission of specimen other than nasopharyngeal swab, presence of viral mutation(s) within the areas targeted by this assay, and inadequate number of viral copies (<131 copies/mL). A negative result must be combined with clinical observations, patient  history, and epidemiological information. The expected result is Negative. Fact Sheet for Patients:  PinkCheek.be Fact Sheet for Healthcare Providers:  GravelBags.it This test is not yet ap proved or cleared by the [place] FDA and  has been authorized for detection and/or diagnosis of SARS-CoV-2 by FDA under an Emergency Use Authorization (EUA). This EUA will remain  in effect (meaning this test can be used) for the duration of the COVID-19 declaration under Section 564(b)(1) of the Act, 21 U.S.C. section 360bbb-3(b)(1), unless the authorization is terminated or revoked sooner.    Influenza A by PCR NEGATIVE NEGATIVE   Influenza B by PCR NEGATIVE NEGATIVE    Comment: (NOTE) The Xpert Xpress SARS-CoV-2/FLU/RSV assay is intended as an aid in  the diagnosis of influenza from Nasopharyngeal swab specimens and  should not be used as a sole basis for treatment. Nasal washings and  aspirates are unacceptable for Xpert Xpress SARS-CoV-2/FLU/RSV  testing. Fact Sheet for Patients: PinkCheek.be Fact Sheet for Healthcare Providers: GravelBags.it This test is not yet approved or cleared by the [place] FDA and  has been authorized for detection and/or diagnosis of SARS-CoV-2 by  FDA under an Emergency Use Authorization (EUA). This EUA will remain  in effect (meaning this test can be used) for the duration of the  Covid-19 declaration under Section 564(b)(1) of the Act, 21  U.S.C. section 360bbb-3(b)(1), unless the authorization is  terminated or revoked. Performed at [facility], [address] [place], [place] [address]   Sample to Blood Bank     Status: None   Collection Time: 11/06/19 12:41 PM  Result Value Ref Range   Blood Bank Specimen SAMPLE AVAILABLE FOR TESTING    Sample Expiration  11/09/2019,2359 Performed at [facility], [address] [place], [place] [address]   Type and screen [facility]     Status: None   Collection Time: 11/06/19 12:41 PM  Result Value Ref Range   ABO/RH(D) O POS    Antibody Screen NEG    Sample Expiration      11/09/2019,2359 Performed at [facility], [address] [place], [place] [address]   CBC with Differential/Platelet     Status: Abnormal   Collection Time: 11/06/19 12:41 PM  Result Value Ref Range   WBC 12.9 (H) 4.0 - 10.5 K/uL   RBC 4.45 3.87 - 5.11 MIL/uL   Hemoglobin 13.1 12.0 - 15.0 g/dL   HCT 24.0 97.3 - 53.2 %   MCV 83.4 80.0 - 100.0 fL   MCH 29.4 26.0 - 34.0 pg   MCHC 35.3 30.0 - 36.0 g/dL   RDW 99.2 42.6 - 83.4 %   Platelets 265 150 - 400 K/uL   nRBC 0.0 0.0 - 0.2 %   Neutrophils Relative % 74 %   Neutro Abs 9.5 (H) 1.7 - 7.7 K/uL   Lymphocytes Relative 19 %   Lymphs Abs 2.5 0.7 - 4.0 K/uL   Monocytes Relative 5 %   Monocytes Absolute 0.6 0.1 - 1.0 K/uL   Eosinophils Relative 1 %   Eosinophils Absolute 0.1 0.0 - 0.5 K/uL   Basophils Relative 0 %   Basophils Absolute 0.0 0.0 - 0.1 K/uL   Immature Granulocytes 1 %   Abs Immature Granulocytes 0.13 (H) 0.00 - 0.07 K/uL    Comment: Performed at [facility], [address] [place], [place] [address]  Comprehensive metabolic panel     Status: Abnormal   Collection Time: 11/06/19 12:41 PM  Result Value Ref Range   Sodium 137 135 - 145 mmol/L   Potassium 3.8 3.5 - 5.1 mmol/L   Chloride 105 98 - 111 mmol/L   CO2 22 22 - 32 mmol/L   Glucose, Bld 94 70 - 99 mg/dL   BUN 9 6 - 20 mg/dL   Creatinine, Ser 2.97 0.44 - 1.00 mg/dL   Calcium 9.6 8.9 - 98.9 mg/dL   Total Protein 6.2 (L) 6.5 - 8.1 g/dL   Albumin 2.9 (L) 3.5 - 5.0 g/dL   AST 12 (L) 15 - 41 U/L   ALT 8 0 - 44 U/L   Alkaline Phosphatase 87 38 - 126 U/L   Total Bilirubin 0.6 0.3 - 1.2 mg/dL   GFR calc non Af Amer >60 >60 mL/min   GFR calc Af Amer >60 >60 mL/min   Anion gap 10 5 - 15    Comment: Performed  at [facility], [address], [place], [place] [address]  ABO/Rh     Status: None   Collection Time: 11/06/19  3:43 PM  Result Value Ref Range   ABO/RH(D)      O POS Performed at [facility], [address] [place], [place] [address]     [employer]  Result Date: 11/06/2019 CLINICAL DATA:  Vaginal bleeding. Gestational age by LMP is 33 weeks and 2 days. EXAM: LIMITED OBSTETRIC ULTRASOUND FINDINGS: Number of Fetuses: 1 Heart Rate:  136 bpm Movement: Yes Presentation: Cephalic Placental Location: Fundal Previa: No Amniotic Fluid (Subjective):  Within normal limits. AFI: 14.4 cm BPD: 7.87 cm 31 w  for d MATERNAL FINDINGS: Cervix:  Appears closed. Uterus/Adnexae: No abnormality visualized. IMPRESSION: No abnormal findings to  explain vaginal bleeding. This exam is performed on an emergent basis and does not comprehensively evaluate fetal size, dating, or anatomy; follow-up complete OB US should be considered if further fetal assessment is warranted. Electronically Signed   By: Romona Curls M.D.   On: 11/06/2019 14:40    Current scheduled medications . betamethasone acetate-betamethasone sodium phosphate      . betamethasone acetate-betamethasone sodium phosphate  12 mg Intramuscular Q24 Hr x 2  . docusate sodium  100 mg Oral Daily  . glyBURIDE  5 mg Oral BID WC  . NIFEdipine  10 mg Oral Q8H  . NIFEdipine  20 mg Oral Once  . prenatal multivitamin  1 tablet Oral Q1200    I have reviewed the patient's current medications.  ASSESSMENT: Active Problems:   Labor and delivery, indication for care   PLAN: Start procardia for preterm contractions. Dr. Logan Bores consulted on plan of care. Continue routine antenatal care.  Doreene Burke, CNM

## 2019-11-06 NOTE — OB Triage Note (Signed)
Pt presents with heavy bright red vaginal bleeding and intermittent abdominal cramping that started 30 mins .

## 2019-11-07 ENCOUNTER — Other Ambulatory Visit (INDEPENDENT_AMBULATORY_CARE_PROVIDER_SITE_OTHER): Payer: Medicaid Other | Admitting: Certified Nurse Midwife

## 2019-11-07 DIAGNOSIS — Z3A32 32 weeks gestation of pregnancy: Secondary | ICD-10-CM | POA: Diagnosis not present

## 2019-11-07 DIAGNOSIS — O468X3 Other antepartum hemorrhage, third trimester: Secondary | ICD-10-CM | POA: Diagnosis not present

## 2019-11-07 DIAGNOSIS — O4693 Antepartum hemorrhage, unspecified, third trimester: Secondary | ICD-10-CM | POA: Diagnosis not present

## 2019-11-07 LAB — GLUCOSE, CAPILLARY
Glucose-Capillary: 109 mg/dL — ABNORMAL HIGH (ref 70–99)
Glucose-Capillary: 92 mg/dL (ref 70–99)
Glucose-Capillary: 116 mg/dL — ABNORMAL HIGH (ref 70–99)

## 2019-11-07 MED ORDER — ACETAMINOPHEN 325 MG PO TABS: 650.0000 mg | ORAL_TABLET | ORAL | 0 refills | Status: DC | PRN

## 2019-11-07 MED ORDER — NIFEDIPINE 10 MG PO CAPS: 10.0000 mg | ORAL_CAPSULE | Freq: Three times a day (TID) | ORAL | 1 refills | Status: DC

## 2019-11-07 NOTE — Progress Notes (Signed)
Patient pharmacy closed. Hospital pharmacy able to provide evening and morning doses.    Gunnar Bulla, CNM [facility], [facility] 11/07/19 7:21 PM

## 2019-11-07 NOTE — Discharge Summary (Signed)
Antenatal Discharge Summary  Patient ID: Janice Kaufman MRN: [id number] DOB/AGE: 05/16/1991 29 y.o.  Admit date: 11/06/2019 Discharge date: 11/07/2019  Admission Diagnoses: Vaginal bleeding in the third trimester, Type 2 diabetes on glyburide  Discharge Diagnoses: No other diagnosis   Prenatal Procedures: NST/Fetal monitoring, Betamethasone, Ultrasound, Labs  Significant Diagnostic Studies:  Results for orders placed or performed during the hospital encounter of 11/06/19 (from the past 168 hour(s))  Respiratory Panel by RT PCR (Flu A&B, Covid) - Nasopharyngeal Swab   Collection Time: 11/06/19 12:41 PM   Specimen: Nasopharyngeal Swab  Result Value Ref Range   SARS Coronavirus 2 by RT PCR NEGATIVE NEGATIVE   Influenza A by PCR NEGATIVE NEGATIVE   Influenza B by PCR NEGATIVE NEGATIVE  CBC with Differential/Platelet   Collection Time: 11/06/19 12:41 PM  Result Value Ref Range   WBC 12.9 (H) 4.0 - 10.5 K/uL   RBC 4.45 3.87 - 5.11 MIL/uL   Hemoglobin 13.1 12.0 - 15.0 g/dL   HCT 38.7 56.4 - 33.2 %   MCV 83.4 80.0 - 100.0 fL   MCH 29.4 26.0 - 34.0 pg   MCHC 35.3 30.0 - 36.0 g/dL   RDW 95.1 88.4 - 16.6 %   Platelets 265 150 - 400 K/uL   nRBC 0.0 0.0 - 0.2 %   Neutrophils Relative % 74 %   Neutro Abs 9.5 (H) 1.7 - 7.7 K/uL   Lymphocytes Relative 19 %   Lymphs Abs 2.5 0.7 - 4.0 K/uL   Monocytes Relative 5 %   Monocytes Absolute 0.6 0.1 - 1.0 K/uL   Eosinophils Relative 1 %   Eosinophils Absolute 0.1 0.0 - 0.5 K/uL   Basophils Relative 0 %   Basophils Absolute 0.0 0.0 - 0.1 K/uL   Immature Granulocytes 1 %   Abs Immature Granulocytes 0.13 (H) 0.00 - 0.07 K/uL  Comprehensive metabolic panel   Collection Time: 11/06/19 12:41 PM  Result Value Ref Range   Sodium 137 135 - 145 mmol/L   Potassium 3.8 3.5 - 5.1 mmol/L   Chloride 105 98 - 111 mmol/L   CO2 22 22 - 32 mmol/L   Glucose, Bld 94 70 - 99 mg/dL   BUN 9 6 - 20 mg/dL   Creatinine, Ser 0.63 0.44 - 1.00 mg/dL   Calcium  9.6 8.9 - 01.6 mg/dL   Total Protein 6.2 (L) 6.5 - 8.1 g/dL   Albumin 2.9 (L) 3.5 - 5.0 g/dL   AST 12 (L) 15 - 41 U/L   ALT 8 0 - 44 U/L   Alkaline Phosphatase 87 38 - 126 U/L   Total Bilirubin 0.6 0.3 - 1.2 mg/dL   GFR calc non Af Amer >60 >60 mL/min   GFR calc Af Amer >60 >60 mL/min   Anion gap 10 5 - 15  Sample to Blood Bank   Collection Time: 11/06/19 12:41 PM  Result Value Ref Range   Blood Bank Specimen SAMPLE AVAILABLE FOR TESTING    Sample Expiration      11/09/2019,2359 Performed at [facility], [address] [place], [place] [address]   Type and screen [facility]   Collection Time: 11/06/19 12:41 PM  Result Value Ref Range   ABO/RH(D) O POS    Antibody Screen NEG    Sample Expiration      11/09/2019,2359 Performed at [facility], [address] [place], [place] [address]   Urinalysis, Routine w reflex microscopic   Collection Time: 11/06/19  1:44  PM  Result Value Ref Range   Color, Urine YELLOW (A) YELLOW   APPearance CLEAR (A) CLEAR   Specific Gravity, Urine 1.012 1.005 - 1.030   pH 6.0 5.0 - 8.0   Glucose, UA NEGATIVE NEGATIVE mg/dL   Hgb urine dipstick LARGE (A) NEGATIVE   Bilirubin Urine NEGATIVE NEGATIVE   Ketones, ur 20 (A) NEGATIVE mg/dL   Protein, ur NEGATIVE NEGATIVE mg/dL   Nitrite NEGATIVE NEGATIVE   Leukocytes,Ua NEGATIVE NEGATIVE   RBC / HPF 21-50 0 - 5 RBC/hpf   WBC, UA 6-10 0 - 5 WBC/hpf   Bacteria, UA NONE SEEN NONE SEEN   Squamous Epithelial / LPF 6-10 0 - 5   Mucus PRESENT   Urine Drug Screen, Qualitative ([facility] only)   Collection Time: 11/06/19  1:44 PM  Result Value Ref Range   Tricyclic, Ur Screen NONE DETECTED NONE DETECTED   Amphetamines, Ur Screen NONE DETECTED NONE DETECTED   MDMA (Ecstasy)Ur Screen NONE DETECTED NONE DETECTED   Cocaine Metabolite,Ur [place] NONE DETECTED NONE DETECTED   Opiate, Ur Screen NONE DETECTED NONE DETECTED   Phencyclidine (PCP) Ur S NONE DETECTED NONE DETECTED    Cannabinoid 50 Ng, Ur [place] NONE DETECTED NONE DETECTED   Barbiturates, Ur Screen NONE DETECTED NONE DETECTED   Benzodiazepine, Ur Scrn NONE DETECTED NONE DETECTED   Methadone Scn, Ur NONE DETECTED NONE DETECTED  Protein / creatinine ratio, urine   Collection Time: 11/06/19  1:44 PM  Result Value Ref Range   Creatinine, Urine 62 mg/dL   Total Protein, Urine 12 mg/dL   Protein Creatinine Ratio 0.19 (H) 0.00 - 0.15 mg/mg[Cre]  ABO/Rh   Collection Time: 11/06/19  3:43 PM  Result Value Ref Range   ABO/RH(D)      O POS Performed at [facility], [address] [place], [place] [address]   Glucose, capillary   Collection Time: 11/06/19  7:39 PM  Result Value Ref Range   Glucose-Capillary 187 (H) 70 - 99 mg/dL  Glucose, capillary   Collection Time: 11/07/19  5:54 AM  Result Value Ref Range   Glucose-Capillary 109 (H) 70 - 99 mg/dL  Glucose, capillary   Collection Time: 11/07/19  1:39 PM  Result Value Ref Range   Glucose-Capillary 92 70 - 99 mg/dL  Glucose, capillary   Collection Time: 11/07/19  5:41 PM  Result Value Ref Range   Glucose-Capillary 116 (H) 70 - 99 mg/dL    Treatments: IV hydration, steroids: betamethasone and procedures: ultrasound  Hospital Course:   This is a 28 y.o. [id number] with IUP at [redacted]w[redacted]d placed under observation for vaginal bleeding.   She was initially observed with contractions, noted to have a cervical exam of 3/50/-3, vertex, and vaginal bleeding. No leaking of fluid.    She was started on procardia for tocolysis.   After continued observation, the fetal heart rate monitoring remained reassuring, and she had no signs/symptoms of progressing preterm labor or other maternal-fetal concerns.   A repeat cervical exam was deferred due to lack of symptoms.   She was deemed stable for discharge to home with outpatient follow up.   Discharge Exam:  BP 133/82   Pulse 85   Temp 98.1 F (36.7 C) (Axillary)   Resp 18   Ht 5\' 6"  (1.676 m)   Wt  85.7 kg   LMP 03/18/2019 (Exact Date) Comment: irregular menses  BMI 30.51 kg/m    General appearance: alert and cooperative  Discharge Condition: Stable  Disposition: Discharge disposition:  01-Home or Self Care       Discharge Instructions    Discharge activity:   Complete by: As directed    Bedrest with bathroom privileges/Modified bedrest   Discharge diet:   Complete by: As directed    Carb restricted diet   Discharge instructions   Complete by: As directed    See AVS   Fetal Kick Count:  Lie on our left side for one hour after a meal, and count the number of times your baby kicks.  If it is less than 5 times, get up, move around and drink some juice.  Repeat the test 30 minutes later.  If it is still less than 5 kicks in an hour, notify your doctor.   Complete by: As directed    No sexual activity restrictions   Complete by: As directed    Notify physician for a general feeling that "something is not right"   Complete by: As directed    Notify physician for increase or change in vaginal discharge   Complete by: As directed    Notify physician for intestinal cramps, with or without diarrhea, sometimes described as "gas pain"   Complete by: As directed    Notify physician for leaking of fluid   Complete by: As directed    Notify physician for low, dull backache, unrelieved by heat or Tylenol   Complete by: As directed    Notify physician for menstrual like cramps   Complete by: As directed    Notify physician for pelvic pressure   Complete by: As directed    Notify physician for uterine contractions.  These may be painless and feel like the uterus is tightening or the baby is  "balling up"   Complete by: As directed    Notify physician for vaginal bleeding   Complete by: As directed    PRETERM LABOR:  Includes any of the follwing symptoms that occur between 20 - [redacted] weeks gestation.  If these symptoms are not stopped, preterm labor can result in preterm delivery,  placing your baby at risk   Complete by: As directed      Allergies as of 11/07/2019   No Known Allergies     Medication List    STOP taking these medications   HYDROcodone-acetaminophen 5-325 MG tablet Commonly known as: NORCO/VICODIN     TAKE these medications   Accu-Chek FastClix Lancets Misc 1 Units by Percutaneous route 4 (four) times daily. Test blood sugar 4 times daily. Before breakfast and 2 hours after every meal.   acetaminophen 325 MG tablet Commonly known as: TYLENOL Take 2 tablets (650 mg total) by mouth every 4 (four) hours as needed (for pain scale < 4  OR  temperature  >/=  100.5 F).   aspirin EC 81 MG tablet Take 1 tablet (81 mg total) by mouth daily.   docusate sodium 100 MG capsule Commonly known as: COLACE Take 100 mg by mouth 2 (two) times daily as needed for mild constipation.   glucose blood test strip Test blood sugar 4 times daily. Before breakfast,2 hours after every meal.   glyBURIDE 5 MG tablet Commonly known as: DIABETA Take 1 tablet (5 mg total) by mouth 2 (two) times daily with a meal.   multivitamin-prenatal 27-0.8 MG Tabs tablet Take 1 tablet by mouth daily.   NIFEdipine 10 MG capsule Commonly known as: PROCARDIA Take 1 capsule (10 mg total) by mouth every 8 (eight) hours.       Lovell Sheehan  Dani Gobble, CNM [facility], [facility] 11/07/19 6:08 PM

## 2019-11-07 NOTE — Progress Notes (Signed)
Patient ID: Janice Kaufman, female   DOB: [birth year], 29 y.o.   MRN: [id number]  Janice Kaufman is a 29 y.o. [id number] at [redacted]w[redacted]d who is under observation for vaginal bleeding.    Estimated Date of Delivery: 12/30/19.   Fetal presentation is cephalic.  Length of Stay:  0 Days. Admitted 11/06/2019  Subjective:  Patient resting quietly in bed with eyes closed. Patient reports good fetal movement.  She reports no uterine contractions, no bleeding and no loss of fluid per vagina.  Denies difficulty breathing or respiratory distress, chest pain, abdominal pain, dysuria, and leg pain or swelling.   Review of Systems:  ROS negative except as noted above. Information obtained from patient.   Objective:   Temp:  [98.1 F (36.7 C)-98.8 F (37.1 C)] 98.1 F (36.7 C) (02/22 1124) Pulse Rate:  [81-86] 85 (02/22 0556) Resp:  [16-18] 18 (02/22 1124) BP: (112-133)/(59-82) 133/82 (02/22 1401)  Physical Examination:  CONSTITUTIONAL: Well-developed, well-nourished female in no acute distress.   SKIN: Skin is warm and dry. No rash noted. Not diaphoretic. No erythema. No pallor.  [place]: Alert and oriented to person, place, and time. Normal reflexes, muscle tone coordination. No cranial nerve deficit noted.  PSYCHIATRIC: Normal mood and affect. Normal behavior. Normal judgment and thought content.  CARDIOVASCULAR: Normal heart rate noted, regular rhythm  RESPIRATORY: Effort and breath sounds normal, no problems with respiration noted  MUSCULOSKELETAL: Normal range of motion. No edema and no tenderness. 2+ distal pulses.  ABDOMEN: Soft, nontender, nondistended, gravid.  CERVIX: Deferred, no complaints at this time.  Fetal monitoring: FHR: 145 bpm, Variability: moderate, Accelerations: Present, Decelerations: Absent   Uterine activity: None, soft resting tone  Results for orders placed or performed during the hospital encounter of 11/06/19 (from the past 48 hour(s))  Respiratory Panel  by RT PCR (Flu A&B, Covid) - Nasopharyngeal Swab     Status: None   Collection Time: 11/06/19 12:41 PM   Specimen: Nasopharyngeal Swab  Result Value Ref Range   SARS Coronavirus 2 by RT PCR NEGATIVE NEGATIVE    Comment: (NOTE) SARS-CoV-2 target nucleic acids are NOT DETECTED. The SARS-CoV-2 RNA is generally detectable in upper respiratoy specimens during the acute phase of infection. The lowest concentration of SARS-CoV-2 viral copies this assay can detect is 131 copies/mL. A negative result does not preclude SARS-Cov-2 infection and should not be used as the sole basis for treatment or other patient management decisions. A negative result may occur with  improper specimen collection/handling, submission of specimen other than nasopharyngeal swab, presence of viral mutation(s) within the areas targeted by this assay, and inadequate number of viral copies (<131 copies/mL). A negative result must be combined with clinical observations, patient history, and epidemiological information. The expected result is Negative. Fact Sheet for Patients:  PinkCheek.be Fact Sheet for Healthcare Providers:  GravelBags.it This test is not yet ap proved or cleared by the [place] FDA and  has been authorized for detection and/or diagnosis of SARS-CoV-2 by FDA under an Emergency Use Authorization (EUA). This EUA will remain  in effect (meaning this test can be used) for the duration of the COVID-19 declaration under Section 564(b)(1) of the Act, 21 U.S.C. section 360bbb-3(b)(1), unless the authorization is terminated or revoked sooner.    Influenza A by PCR NEGATIVE NEGATIVE   Influenza B by PCR NEGATIVE NEGATIVE    Comment: (NOTE) The Xpert Xpress SARS-CoV-2/FLU/RSV assay is intended as an aid in  the diagnosis of influenza from Nasopharyngeal swab specimens and  should not be used as a sole basis for treatment. Nasal washings and   aspirates are unacceptable for Xpert Xpress SARS-CoV-2/FLU/RSV  testing. Fact Sheet for Patients: https://www.moore.com/ Fact Sheet for Healthcare Providers: https://www.young.biz/ This test is not yet approved or cleared by the [place] FDA and  has been authorized for detection and/or diagnosis of SARS-CoV-2 by  FDA under an Emergency Use Authorization (EUA). This EUA will remain  in effect (meaning this test can be used) for the duration of the  Covid-19 declaration under Section 564(b)(1) of the Act, 21  U.S.C. section 360bbb-3(b)(1), unless the authorization is  terminated or revoked. Performed at [facility], [address] [place], [place] [address]   Sample to Blood Bank     Status: None   Collection Time: 11/06/19 12:41 PM  Result Value Ref Range   Blood Bank Specimen SAMPLE AVAILABLE FOR TESTING    Sample Expiration      11/09/2019,2359 Performed at [facility], [address] [place], [place] [address]   Type and screen [facility]     Status: None   Collection Time: 11/06/19 12:41 PM  Result Value Ref Range   ABO/RH(D) O POS    Antibody Screen NEG    Sample Expiration      11/09/2019,2359 Performed at [facility], [address] [place], [place] [address]   CBC with Differential/Platelet     Status: Abnormal   Collection Time: 11/06/19 12:41 PM  Result Value Ref Range   WBC 12.9 (H) 4.0 - 10.5 K/uL   RBC 4.45 3.87 - 5.11 MIL/uL   Hemoglobin 13.1 12.0 - 15.0 g/dL   HCT 63.8 46.6 - 59.9 %   MCV 83.4 80.0 - 100.0 fL   MCH 29.4 26.0 - 34.0 pg   MCHC 35.3 30.0 - 36.0 g/dL   RDW 35.7 01.7 - 79.3 %   Platelets 265 150 - 400 K/uL   nRBC 0.0 0.0 - 0.2 %   Neutrophils Relative % 74 %   Neutro Abs 9.5 (H) 1.7 - 7.7 K/uL   Lymphocytes Relative 19 %   Lymphs Abs 2.5 0.7 - 4.0 K/uL   Monocytes Relative 5 %   Monocytes Absolute 0.6 0.1 - 1.0 K/uL   Eosinophils  Relative 1 %   Eosinophils Absolute 0.1 0.0 - 0.5 K/uL   Basophils Relative 0 %   Basophils Absolute 0.0 0.0 - 0.1 K/uL   Immature Granulocytes 1 %   Abs Immature Granulocytes 0.13 (H) 0.00 - 0.07 K/uL    Comment: Performed at [facility], [address] [place], [place] [address]  Comprehensive metabolic panel     Status: Abnormal   Collection Time: 11/06/19 12:41 PM  Result Value Ref Range   Sodium 137 135 - 145 mmol/L   Potassium 3.8 3.5 - 5.1 mmol/L   Chloride 105 98 - 111 mmol/L   CO2 22 22 - 32 mmol/L   Glucose, Bld 94 70 - 99 mg/dL   BUN 9 6 - 20 mg/dL   Creatinine, Ser 9.23 0.44 - 1.00 mg/dL   Calcium 9.6 8.9 - 30.0 mg/dL   Total Protein 6.2 (L) 6.5 - 8.1 g/dL   Albumin 2.9 (L) 3.5 - 5.0 g/dL   AST 12 (L) 15 - 41 U/L   ALT 8 0 - 44 U/L   Alkaline Phosphatase 87 38 - 126 U/L   Total Bilirubin 0.6 0.3 - 1.2 mg/dL   GFR calc non Af  Amer >60 >60 mL/min   GFR calc Af Amer >60 >60 mL/min   Anion gap 10 5 - 15    Comment: Performed at [facility], [address] [place], [place] [address]  Urinalysis, Routine w reflex microscopic     Status: Abnormal   Collection Time: 11/06/19  1:44 PM  Result Value Ref Range   Color, Urine YELLOW (A) YELLOW   APPearance CLEAR (A) CLEAR   Specific Gravity, Urine 1.012 1.005 - 1.030   pH 6.0 5.0 - 8.0   Glucose, UA NEGATIVE NEGATIVE mg/dL   Hgb urine dipstick LARGE (A) NEGATIVE   Bilirubin Urine NEGATIVE NEGATIVE   Ketones, ur 20 (A) NEGATIVE mg/dL   Protein, ur NEGATIVE NEGATIVE mg/dL   Nitrite NEGATIVE NEGATIVE   Leukocytes,Ua NEGATIVE NEGATIVE   RBC / HPF 21-50 0 - 5 RBC/hpf   WBC, UA 6-10 0 - 5 WBC/hpf   Bacteria, UA NONE SEEN NONE SEEN   Squamous Epithelial / LPF 6-10 0 - 5   Mucus PRESENT     Comment: Performed at [facility], [address], [place], [place] [address]  Urine Drug Screen, Qualitative ([facility] only)     Status: None   Collection Time: 11/06/19  1:44 PM  Result Value Ref Range    Tricyclic, Ur Screen NONE DETECTED NONE DETECTED   Amphetamines, Ur Screen NONE DETECTED NONE DETECTED   MDMA (Ecstasy)Ur Screen NONE DETECTED NONE DETECTED   Cocaine Metabolite,Ur [place] NONE DETECTED NONE DETECTED   Opiate, Ur Screen NONE DETECTED NONE DETECTED   Phencyclidine (PCP) Ur S NONE DETECTED NONE DETECTED   Cannabinoid 50 Ng, Ur  NONE DETECTED NONE DETECTED   Barbiturates, Ur Screen NONE DETECTED NONE DETECTED   Benzodiazepine, Ur Scrn NONE DETECTED NONE DETECTED   Methadone Scn, Ur NONE DETECTED NONE DETECTED    Comment: (NOTE) Tricyclics + metabolites, urine    Cutoff 1000 ng/mL Amphetamines + metabolites, urine  Cutoff 1000 ng/mL MDMA (Ecstasy), urine              Cutoff 500 ng/mL Cocaine Metabolite, urine          Cutoff 300 ng/mL Opiate + metabolites, urine        Cutoff 300 ng/mL Phencyclidine (PCP), urine         Cutoff 25 ng/mL Cannabinoid, urine                 Cutoff 50 ng/mL Barbiturates + metabolites, urine  Cutoff 200 ng/mL Benzodiazepine, urine              Cutoff 200 ng/mL Methadone, urine                   Cutoff 300 ng/mL The urine drug screen provides only a preliminary, unconfirmed analytical test result and should not be used for non-medical purposes. Clinical consideration and professional judgment should be applied to any positive drug screen result due to possible interfering substances. A more specific alternate chemical method must be used in order to obtain a confirmed analytical result. Gas chromatography / mass spectrometry (GC/MS) is the preferred confirmat ory method. Performed at [facility], [address] [place], [place] [address]   Protein / creatinine ratio, urine     Status: Abnormal   Collection Time: 11/06/19  1:44 PM  Result Value Ref Range   Creatinine, Urine 62 mg/dL   Total Protein, Urine 12 mg/dL    Comment: NO NORMAL RANGE ESTABLISHED FOR THIS TEST  Protein Creatinine Ratio 0.19 (H) 0.00 - 0.15 mg/mg[Cre]     Comment: Performed at [facility], [address] [place] [place], [place] [address]  ABO/Rh     Status: None   Collection Time: 11/06/19  3:43 PM  Result Value Ref Range   ABO/RH(D)      O POS Performed at [facility], [address] [place], [place] [address]   Glucose, capillary     Status: Abnormal   Collection Time: 11/06/19  7:39 PM  Result Value Ref Range   Glucose-Capillary 187 (H) 70 - 99 mg/dL  Glucose, capillary     Status: Abnormal   Collection Time: 11/07/19  5:54 AM  Result Value Ref Range   Glucose-Capillary 109 (H) 70 - 99 mg/dL  Glucose, capillary     Status: None   Collection Time: 11/07/19  1:39 PM  Result Value Ref Range   Glucose-Capillary 92 70 - 99 mg/dL  Glucose, capillary     Status: Abnormal   Collection Time: 11/07/19  5:41 PM  Result Value Ref Range   Glucose-Capillary 116 (H) 70 - 99 mg/dL    US OB Limited  Result Date: 11/06/2019 CLINICAL DATA:  Vaginal bleeding. Gestational age by LMP is 33 weeks and 2 days. EXAM: LIMITED OBSTETRIC ULTRASOUND FINDINGS: Number of Fetuses: 1 Heart Rate:  136 bpm Movement: Yes Presentation: Cephalic Placental Location: Fundal Previa: No Amniotic Fluid (Subjective):  Within normal limits. AFI: 14.4 cm BPD: 7.87 cm 31 w  for d MATERNAL FINDINGS: Cervix:  Appears closed. Uterus/Adnexae: No abnormality visualized. IMPRESSION: No abnormal findings to explain vaginal bleeding. This exam is performed on an emergent basis and does not comprehensively evaluate fetal size, dating, or anatomy; follow-up complete OB US should be considered if further fetal assessment is warranted. Electronically Signed   By: Romona Curls M.D.   On: 11/06/2019 14:40    Current scheduled medications . docusate sodium  100 mg Oral Daily  . glyBURIDE  5 mg Oral BID WC  . NIFEdipine  10 mg Oral Q8H  . prenatal multivitamin  1 tablet Oral Q1200    I have reviewed the patient's current medications.  ASSESSMENT: Patient Active Problem  List   Diagnosis Date Noted  . Labor and delivery, indication for care 11/06/2019  . Smoker 09/23/2019  . History of gestational hypertension 09/23/2019  . History of gestational diabetes 09/23/2019  . History of pre-eclampsia 09/23/2019  . Type 2 diabetes mellitus with hyperglycemia, without long-term current use of insulin (HCC) 11/16/2018    PLAN:  Continue orders as written. Reassess as needed.   Plan for discharge this evening after second dose of betamethasone.    Gunnar Bulla, CNM [facility], [facility] 11/07/19 6:00 PM

## 2019-11-07 NOTE — OB Triage Note (Signed)
Pt. discharged home, stable, with no further bleeding or pain.

## 2019-11-07 NOTE — Discharge Instructions (Signed)
Abnormal Uterine Bleeding Abnormal uterine bleeding is unusual bleeding from the uterus. It includes:  Bleeding or spotting between periods.  Bleeding after sex.  Bleeding that is heavier than normal.  Periods that last longer than usual.  Bleeding after menopause. Abnormal uterine bleeding can affect women at various stages in life, including teenagers, women in their reproductive years, pregnant women, and women who have reached menopause. Common causes of abnormal uterine bleeding include:  Pregnancy.  Growths of tissue (polyps).  A noncancerous tumor in the uterus (fibroid).  Infection.  Cancer.  Hormonal imbalances. Any type of abnormal bleeding should be evaluated by a health care provider. Many cases are minor and simple to treat, while others are more serious. Treatment will depend on the cause of the bleeding. Follow these instructions at home:  Monitor your condition for any changes.  Do not use tampons, douche, or have sex if told by your health care provider.  Change your pads often.  Get regular exams that include pelvic exams and cervical cancer screening.  Keep all follow-up visits as told by your health care provider. This is important. Contact a health care provider if:  Your bleeding lasts for more than one week.  You feel dizzy at times.  You feel nauseous or you vomit. Get help right away if:  You pass out.  Your bleeding soaks through a pad every hour.  You have abdominal pain.  You have a fever.  You become sweaty or weak.  You pass large blood clots from your vagina. Summary  Abnormal uterine bleeding is unusual bleeding from the uterus.  Any type of abnormal bleeding should be evaluated by a health care provider. Many cases are minor and simple to treat, while others are more serious.  Treatment will depend on the cause of the bleeding. This information is not intended to replace advice given to you by your health care  provider. Make sure you discuss any questions you have with your health care provider. Document Revised: 12/09/2017 Document Reviewed: 10/03/2016 Elsevier Patient Education  2020 Elsevier Inc. Nifedipine Oral Capsules What is this medicine? NIFEDIPINE (nye FED i peen) is a calcium channel blocker. It relaxes your blood vessels and decreases the amount of work the heart has to do. It treats and/or prevents chest pain (also called angina). This medicine may be used for other purposes; ask your health care provider or pharmacist if you have questions. COMMON BRAND NAME(S): Adalat, Procardia What should I tell my health care provider before I take this medicine? They need to know if you have any of these conditions:  heart attack  heart disease  heart failure  high blood pressure  low blood pressure  an unusual or allergic reaction to nifedipine, other drugs, foods, dyes or preservatives  pregnant or trying to get pregnant  breast-feeding How should I use this medicine? Take this drug by mouth. Take it as directed on the prescription label at the same time every day. You can take it with or without food. If it upsets your stomach, take it with food. Keep taking it unless your health care provider tells you to stop. Do not take this drug with grapefruit juice. Talk to your health care provider about the use of this drug in children. Special care may be needed. Overdosage: If you think you have taken too much of this medicine contact a poison control center or emergency room at once. NOTE: This medicine is only for you. Do not share this medicine  with others. What if I miss a dose? If you miss a dose, take it as soon as you can. If it is almost time for your next dose, take only that dose. Do not take double or extra doses. What may interact with this medicine? Do not take this medicine with any of the following medications:  certain medicines for seizures like carbamazepine,  phenobarbital, phenytoin  lumacaftor; ivacaftor  rifabutin  rifampin  rifapentine  St. John's Wort This medicine may also interact with the following medications:  antiviral medicines for HIV or AIDS  certain medicines for blood pressure  certain medicines for diabetes  certain medicines for erectile dysfunction  certain medicines for fungal infections like ketoconazole, fluconazole, and itraconazole  certain medicines for irregular heart beat like flecainide and quinidine  certain medicines that treat or prevent blood clots like warfarin  clarithromycin  digoxin  dolasetron  erythromycin  fluoxetine  grapefruit juice  local or general anesthetics  nefazodone  orlistat  quinupristin; dalfopristin  sirolimus  stomach acid blockers like cimetidine, ranitidine, omeprazole, or pantoprazole  tacrolimus  valproic acid This list may not describe all possible interactions. Give your health care provider a list of all the medicines, herbs, non-prescription drugs, or dietary supplements you use. Also tell them if you smoke, drink alcohol, or use illegal drugs. Some items may interact with your medicine. What should I watch for while using this medicine? Visit your health care provider for regular checks on your progress. Check your blood pressure as directed. Ask your health care provider what your blood pressure should be. Also, find out when you should contact him or her. Do not treat yourself for coughs, colds, or pain while you are using this drug without asking your health care provider for advice. Some drugs may increase your blood pressure. You may get drowsy or dizzy. Do not drive, use machinery, or do anything that needs mental alertness until you know how this drug affects you. Do not stand up or sit up quickly, especially if you are an older patient. This reduces the risk of dizzy or fainting spells. What side effects may I notice from receiving this  medicine? Side effects that you should report to your doctor or health care provider as soon as possible:  allergic reactions (skin rash, itching or hives; swelling of the face, lips, or tongue)  heart attack (trouble breathing; pain or tightness in the chest, neck, back or arms; unusually weak or tired)  heart failure (trouble breathing; fast, irregular heartbeat; sudden weight gain; swelling of the ankles, feet, hands; unusually weak or tired)  low blood pressure (dizziness; feeling faint or lightheaded, falls; unusually weak or tired) Side effects that usually do not require medical attention (report to your doctor or health care provider if they continue or are bothersome):  changes in emotions or moods  cough  facial flushing  headache  heartburn (burning feeling in chest, often after eating or when lying down)  muscle cramps  nasal congestion (like runny or stuffy nose)  nausea  tremors This list may not describe all possible side effects. Call your doctor for medical advice about side effects. You may report side effects to FDA at 1-800-FDA-1088. Where should I keep my medicine? Keep out of the reach of children and pets. Store at room temperature between 15 and 25 degrees C (59 and 77 degrees F). Protect from light and moisture. Keep the container tightly closed. Throw away any unused drug after the expiration date. NOTE: This  sheet is a summary. It may not cover all possible information. If you have questions about this medicine, talk to your doctor, pharmacist, or health care provider.  2020 Elsevier/Gold Standard (2019-06-07 19:08:10)   Preterm Labor and Birth Information Pregnancy normally lasts 39-41 weeks. Preterm labor is when labor starts early. It starts before you have been pregnant for 37 whole weeks. What are the risk factors for preterm labor? Preterm labor is more likely to occur in women who:  Have an infection while pregnant.  Have a cervix that is  short.  Have gone into preterm labor before.  Have had surgery on their cervix.  Are younger than age 70.  Are older than age 54.  Are African American.  Are pregnant with two or more babies.  Take street drugs while pregnant.  Smoke while pregnant.  Do not gain enough weight while pregnant.  Got pregnant right after another pregnancy. What are the symptoms of preterm labor? Symptoms of preterm labor include:  Cramps. The cramps may feel like the cramps some women get during their period. The cramps may happen with watery poop (diarrhea).  Pain in the belly (abdomen).  Pain in the lower back.  Regular contractions or tightening. It may feel like your belly is getting tighter.  Pressure in the lower belly that seems to get stronger.  More fluid (discharge) leaking from the vagina. The fluid may be watery or bloody.  Water breaking. Why is it important to notice signs of preterm labor? Babies who are born early may not be fully developed. They have a higher chance for:  Long-term heart problems.  Long-term lung problems.  Trouble controlling body systems, like breathing.  Bleeding in the brain.  A condition called cerebral palsy.  Learning difficulties.  Death. These risks are highest for babies who are born before 34 weeks of pregnancy. How is preterm labor treated? Treatment depends on:  How long you were pregnant.  Your condition.  The health of your baby. Treatment may involve:  Having a stitch (suture) placed in your cervix. When you give birth, your cervix opens so the baby can come out. The stitch keeps the cervix from opening too soon.  Staying at the hospital.  Taking or getting medicines, such as: ? Hormone medicines. ? Medicines to stop contractions. ? Medicines to help the baby's lungs develop. ? Medicines to prevent your baby from having cerebral palsy. What should I do if I am in preterm labor? If you think you are going into labor  too soon, call your doctor right away. How can I prevent preterm labor?  Do not use any tobacco products. ? Examples of these are cigarettes, chewing tobacco, and e-cigarettes. ? If you need help quitting, ask your doctor.  Do not use street drugs.  Do not use any medicines unless you ask your doctor if they are safe for you.  Talk with your doctor before taking any herbal supplements.  Make sure you gain enough weight.  Watch for infection. If you think you might have an infection, get it checked right away.  If you have gone into preterm labor before, tell your doctor. This information is not intended to replace advice given to you by your health care provider. Make sure you discuss any questions you have with your health care provider. Document Revised: 12/24/2018 Document Reviewed: 01/23/2016 Elsevier Patient Education  2020 [employer].   Preventing Preterm Birth Preterm birth is when your baby is delivered between 20 weeks and  37 weeks of pregnancy. A full-term pregnancy lasts for at least 37 weeks. Preterm birth can be dangerous for your baby because the last few weeks of pregnancy are an important time for your baby's brain and lungs to grow. Many things can cause a baby to be born early. Sometimes the cause is not known. There are certain factors that make you more likely to experience preterm birth, such as:  Having a previous baby born preterm.  Being pregnant with twins or other multiples.  Having had fertility treatment.  Being overweight or underweight at the start of your pregnancy.  Having any of the following during pregnancy: ? An infection, including a urinary tract infection (UTI) or an STI (sexually transmitted infection). ? High blood pressure. ? Diabetes. ? Vaginal bleeding.  Being age 26 or older.  Being age 43 or younger.  Getting pregnant within 6 months of a previous pregnancy.  Suffering extreme stress or physical or emotional abuse during  pregnancy.  Standing for long periods of time during pregnancy, such as working at a job that requires standing. What are the risks? The most serious risk of preterm birth is that the baby may not survive. This is more likely to happen if a baby is born before 34 weeks. Other risks and complications of preterm birth may include your baby having:  Breathing problems.  Brain damage that affects movement and coordination (cerebral palsy).  Feeding difficulties.  Vision or hearing problems.  Infections or inflammation of the digestive tract (colitis).  Developmental delays.  Learning disabilities.  Higher risk for diabetes, heart disease, and high blood pressure later in life. What can I do to lower my risk?  Medical care The most important thing you can do to lower your risk for preterm birth is to get routine medical care during pregnancy (prenatal care). If you have a high risk of preterm birth, you may be referred to a health care provider who specializes in managing high-risk pregnancies (perinatologist). You may be given medicine to help prevent preterm birth. Lifestyle changes Certain lifestyle changes can also lower your risk of preterm birth:  Wait at least 6 months after a pregnancy to become pregnant again.  Try to plan pregnancy for when you are between 36 and 58 years old.  Get to a healthy weight before getting pregnant. If you are overweight, work with your health care provider to safely lose weight.  Do not use any products that contain nicotine or tobacco, such as cigarettes and e-cigarettes. If you need help quitting, ask your health care provider.  Do not drink alcohol.  Do not use drugs. Where to find support For more support, consider:  Talking with your health care provider.  Talking with a therapist or substance abuse counselor, if you need help quitting.  Working with a diet and nutrition specialist (dietitian) or a Systems analyst to maintain a  healthy weight.  Joining a support group. Where to find more information Learn more about preventing preterm birth from:  Centers for Disease Control and Prevention: http://curry.org/  March of Dimes: marchofdimes.org/complications/premature-babies.aspx  American Pregnancy Association: americanpregnancy.org/labor-and-birth/premature-labor Contact a health care provider if:  You have any of the following signs of preterm labor before 37 weeks: ? A change or increase in vaginal discharge. ? Fluid leaking from your vagina. ? Pressure or cramps in your lower abdomen. ? A backache that does not go away or gets worse. ? Regular tightening (contractions) in your lower abdomen. Summary  Preterm birth means having  your baby during weeks 20-37 of pregnancy.  Preterm birth may put your baby at risk for physical and mental problems.  Getting good prenatal care can help prevent preterm birth.  You can lower your risk of preterm birth by making certain lifestyle changes, such as not smoking and not using alcohol. This information is not intended to replace advice given to you by your health care provider. Make sure you discuss any questions you have with your health care provider. Document Revised: 08/14/2017 Document Reviewed: 05/10/2016 Elsevier Patient Education  2020 Elsevier Inc.   Vaginal Bleeding During Pregnancy, Third Trimester  A small amount of bleeding (spotting) from the vagina is common during pregnancy. Sometimes the bleeding is normal and is not a problem, and sometimes it is a sign of something serious. Tell your doctor about any bleeding from your vagina right away. Follow these instructions at home: Activity  Follow your doctor's instructions about how active you can be. Your doctor may recommend that you: ? Stay in bed and only get up to use the bathroom. ? Continue light activity.  If needed, make plans for someone to  help you with your normal activities.  Ask your doctor if it is safe for you to drive.  Do not lift anything that is heavier than 10 lb (4.5 kg) until your doctor says that this is safe.  Do not have sex or orgasms until your doctor says that this is safe. Medicines  Take over-the-counter and prescription medicines only as told by your doctor.  Do not take aspirin. It can cause bleeding. General instructions  Watch your condition for any changes.  Write down: ? The number of pads you use each day. ? How often you change pads. ? How soaked (saturated) your pads are.  Do not use tampons.  Do not douche.  If you pass any tissue from your vagina, save the tissue to show your doctor.  Keep all follow-up visits as told by your doctor. This is important. Contact a doctor if:  You have vaginal bleeding at any time during pregnancy.  You have cramps.  You have a fever. Get help right away if:  You have very bad cramps.  You have very bad pain in your back or belly (abdomen).  You have a gush of fluid from your vagina.  You pass large clots or a lot of tissue from your vagina.  Your bleeding gets worse.  You feel light-headed or weak.  You pass out (faint).  Your baby is moving less than usual, or not moving at all. Summary  Tell your doctor about any bleeding from your vagina right away.  Follow instructions from your doctor about how active you can be. You may need someone to help you with your normal activities. This information is not intended to replace advice given to you by your health care provider. Make sure you discuss any questions you have with your health care provider. Document Revised: 12/21/2018 Document Reviewed: 12/03/2016 Elsevier Patient Education  2020 Elsevier Inc. Fetal Movement Counts Patient Name: ________________________________________________ Patient Due Date: ____________________ What is a fetal movement count?  A fetal movement  count is the number of times that you feel your baby move during a certain amount of time. This may also be called a fetal kick count. A fetal movement count is recommended for every pregnant woman. You may be asked to start counting fetal movements as early as week 28 of your pregnancy. Pay attention to when your baby  is most active. You may notice your baby's sleep and wake cycles. You may also notice things that make your baby move more. You should do a fetal movement count:  When your baby is normally most active.  At the same time each day. A good time to count movements is while you are resting, after having something to eat and drink. How do I count fetal movements? 1. Find a quiet, comfortable area. Sit, or lie down on your side. 2. Write down the date, the start time and stop time, and the number of movements that you felt between those two times. Take this information with you to your health care visits. 3. Write down your start time when you feel the first movement. 4. Count kicks, flutters, swishes, rolls, and jabs. You should feel at least 10 movements. 5. You may stop counting after you have felt 10 movements, or if you have been counting for 2 hours. Write down the stop time. 6. If you do not feel 10 movements in 2 hours, contact your health care provider for further instructions. Your health care provider may want to do additional tests to assess your baby's well-being. Contact a health care provider if:  You feel fewer than 10 movements in 2 hours.  Your baby is not moving like he or she usually does. Date: ____________ Start time: ____________ Stop time: ____________ Movements: ____________ Date: ____________ Start time: ____________ Stop time: ____________ Movements: ____________ Date: ____________ Start time: ____________ Stop time: ____________ Movements: ____________ Date: ____________ Start time: ____________ Stop time: ____________ Movements: ____________ Date:  ____________ Start time: ____________ Stop time: ____________ Movements: ____________ Date: ____________ Start time: ____________ Stop time: ____________ Movements: ____________ Date: ____________ Start time: ____________ Stop time: ____________ Movements: ____________ Date: ____________ Start time: ____________ Stop time: ____________ Movements: ____________ Date: ____________ Start time: ____________ Stop time: ____________ Movements: ____________ This information is not intended to replace advice given to you by your health care provider. Make sure you discuss any questions you have with your health care provider. Document Revised: 04/21/2019 Document Reviewed: 04/21/2019 Elsevier Patient Education  [place].

## 2019-11-07 NOTE — Progress Notes (Signed)
Inpatient Diabetes Program Recommendations  AACE/ADA: New Consensus Statement on Inpatient Glycemic Control (2015)  Target Ranges:  Prepandial:   less than 140 mg/dL      Peak postprandial:   less than 180 mg/dL (1-2 hours)      Critically ill patients:  140 - 180 mg/dL   Lab Results  Component Value Date   GLUCAP 109 (H) 11/07/2019   HGBA1C 6.2 (H) 09/23/2019    Review of Glycemic Control Results for JANESHA, BRISSETTE (MRN [id number]) as of 11/07/2019 09:08  Ref. Range 10/26/2019 12:44 11/06/2019 19:39 11/07/2019 05:54  Glucose-Capillary Latest Ref Range: 70 - 99 mg/dL 353 (H) 317 (H) 409 (H)   Diabetes history: Type 2 DM Outpatient Diabetes medications: Glyburide 5 mg BID Current orders for Inpatient glycemic control: Glyburide 5 mg BID BMZ x 1 (scheduled for 2nd dose today)  Inpatient Diabetes Program Recommendations:    Anticipate glucose trends to increase given steroids. Consider adding Novolog 0-14 units TID (under diabetic pregnant patient).   Thanks, Lujean Rave, MSN, RNC-OB Diabetes Coordinator [phone] (8a-5p)

## 2019-11-17 ENCOUNTER — Other Ambulatory Visit: Payer: Self-pay

## 2019-11-17 ENCOUNTER — Other Ambulatory Visit: Payer: Medicaid Other

## 2019-11-17 ENCOUNTER — Ambulatory Visit (INDEPENDENT_AMBULATORY_CARE_PROVIDER_SITE_OTHER): Payer: Medicaid Other

## 2019-11-17 ENCOUNTER — Ambulatory Visit (INDEPENDENT_AMBULATORY_CARE_PROVIDER_SITE_OTHER): Payer: Medicaid Other | Admitting: Certified Nurse Midwife

## 2019-11-17 VITALS — BP 148/91 | HR 115 | Wt 195.2 lb

## 2019-11-17 DIAGNOSIS — Z3483 Encounter for supervision of other normal pregnancy, third trimester: Secondary | ICD-10-CM

## 2019-11-17 DIAGNOSIS — O288 Other abnormal findings on antenatal screening of mother: Secondary | ICD-10-CM

## 2019-11-17 DIAGNOSIS — Z3A34 34 weeks gestation of pregnancy: Secondary | ICD-10-CM

## 2019-11-17 LAB — POCT URINALYSIS DIPSTICK OB
Bilirubin, UA: NEGATIVE
Blood, UA: NEGATIVE
Glucose, UA: NEGATIVE
Leukocytes, UA: NEGATIVE
Nitrite, UA: NEGATIVE
Spec Grav, UA: 1.025 (ref 1.010–1.025)
Urobilinogen, UA: 0.2 E.U./dL
pH, UA: 5 (ref 5.0–8.0)

## 2019-11-17 NOTE — Patient Instructions (Signed)
Fetal Movement Counts Patient Name: ________________________________________________ Patient Due Date: ____________________ What is a fetal movement count?  A fetal movement count is the number of times that you feel your baby move during a certain amount of time. This may also be called a fetal kick count. A fetal movement count is recommended for every pregnant woman. You may be asked to start counting fetal movements as early as week 28 of your pregnancy. Pay attention to when your baby is most active. You may notice your baby's sleep and wake cycles. You may also notice things that make your baby move more. You should do a fetal movement count:  When your baby is normally most active.  At the same time each day. A good time to count movements is while you are resting, after having something to eat and drink. How do I count fetal movements? 1. Find a quiet, comfortable area. Sit, or lie down on your side. 2. Write down the date, the start time and stop time, and the number of movements that you felt between those two times. Take this information with you to your health care visits. 3. Write down your start time when you feel the first movement. 4. Count kicks, flutters, swishes, rolls, and jabs. You should feel at least 10 movements. 5. You may stop counting after you have felt 10 movements, or if you have been counting for 2 hours. Write down the stop time. 6. If you do not feel 10 movements in 2 hours, contact your health care provider for further instructions. Your health care provider may want to do additional tests to assess your baby's well-being. Contact a health care provider if:  You feel fewer than 10 movements in 2 hours.  Your baby is not moving like he or she usually does. Date: ____________ Start time: ____________ Stop time: ____________ Movements: ____________ Date: ____________ Start time: ____________ Stop time: ____________ Movements: ____________ Date: ____________  Start time: ____________ Stop time: ____________ Movements: ____________ Date: ____________ Start time: ____________ Stop time: ____________ Movements: ____________ Date: ____________ Start time: ____________ Stop time: ____________ Movements: ____________ Date: ____________ Start time: ____________ Stop time: ____________ Movements: ____________ Date: ____________ Start time: ____________ Stop time: ____________ Movements: ____________ Date: ____________ Start time: ____________ Stop time: ____________ Movements: ____________ Date: ____________ Start time: ____________ Stop time: ____________ Movements: ____________ This information is not intended to replace advice given to you by your health care provider. Make sure you discuss any questions you have with your health care provider. Document Revised: 04/21/2019 Document Reviewed: 04/21/2019 Elsevier Patient Education  2020 Elsevier Inc[employer]  Nonstress Test A nonstress test is a procedure that is done during pregnancy in order to check the baby's heartbeat. The procedure can help show if the baby (fetus) is healthy. It is commonly done when:  The baby is past his or her due date.  The pregnancy is high risk.  The baby is moving less than normal.  The mother has lost a pregnancy in the past.  The health care provider suspects a problem with the baby's growth.  There is too much or too little amniotic fluid. The procedure is often done in the third trimester of pregnancy to find out if an early delivery is needed and whether such a delivery is safe. During a nonstress test, the baby's heartbeat is monitored when the baby is resting and when the baby is moving. If the baby is healthy, the heart rate will increase when he or she moves or kicks and   will return to normal when he or she rests. Tell a health care provider about:  Any allergies you have.  Any medical conditions you have.  All medicines you are taking, including vitamins, herbs,  eye drops, creams, and over-the-counter medicines. What are the risks? There are no risks to you or your baby from a nonstress test. This procedure should not be painful or uncomfortable. What happens before the procedure?  Eat a meal right before the test or as directed by your health care provider. Food may help encourage the baby to move.  Use the restroom right before the test. What happens during the procedure?  Two monitors will be placed on your abdomen. One will record the baby's heart rate and the other will record the contractions of your uterus.  You may be asked to lie down on your side or to sit upright.  You may be given a button to press when you feel your baby move.  Your health care provider will listen to your baby's heartbeat and recorded it. He or she may also watch your baby's heartbeat on a screen.  If the baby seems to be sleeping, you may be asked to drink some juice or soda, eat a snack, or change positions. The procedure may vary among health care providers and hospitals. What happens after the procedure?  Your health care provider will discuss the test results with you and make recommendations for the future. Depending on the results, your health care provider may order additional tests or another course of action.  If your health care provider gave you any diet or activity instructions, make sure to follow them.  Keep all follow-up visits as told by your health care provider. This is important. Summary  A nonstress test is a procedure that is done during pregnancy in order to check the baby's heartbeat. The procedure can help show if the baby is healthy.  The procedure is often done in the third trimester of pregnancy to find out if an early delivery is needed and whether such a delivery is safe.  During a nonstress test, the baby's heartbeat is monitored when the baby is resting and when the baby is moving. If the baby is healthy, the heart rate will  increase when he or she moves or kicks and will return to normal when he or she rests.  Your health care provider will discuss the test results with you and make recommendations for the future. This information is not intended to replace advice given to you by your health care provider. Make sure you discuss any questions you have with your health care provider. Document Revised: 12/11/2016 Document Reviewed: 12/11/2016 Elsevier Patient Education  2020 Elsevier Inc.  

## 2019-11-17 NOTE — Progress Notes (Signed)
ROB-Doing well, no questions or concerns. Forgot meter and log; however, reports normal fastings with elevated levels after meals. Advised to send meter log through MyChart. NST reactive, see flowsheet. AFI low normal. Findings discussed with patient. Anticiaptory guidance regarding course of prenatal care. Reviewed red flag symptoms and when to call. RTC x 1 week for NST, AFI, and ROB or sooner if needed.   ULTRASOUND REPORT  Location: [facility] Date of Service: 11/17/2019   Indications:growth/afi   Findings:  Janice Kaufman intrauterine pregnancy is visualized with FHR at 172 BPM. Biometrics give an (U/S) Gestational age of [redacted]w[redacted]d and an (U/S) EDD of 12/27/2019; this correlates with the clinically established Estimated Date of Delivery: 12/30/19.  Fetal presentation is transverse.  Placenta: fundal. Grade: 1 AFI: 8.4 cm  Growth percentile is 54. EFW: 2447 g (5 lbs 6 oz)   Impression: 1. [redacted]w[redacted]d Viable Singleton Intrauterine pregnancy previously established criteria. 2. Growth is 54 %ile.  AFI is 8.4 cm.   Recommendations: 1.Clinical correlation with the patient's History and Physical Exam.

## 2019-11-23 ENCOUNTER — Ambulatory Visit (INDEPENDENT_AMBULATORY_CARE_PROVIDER_SITE_OTHER): Payer: Medicaid Other

## 2019-11-23 ENCOUNTER — Other Ambulatory Visit: Payer: Self-pay | Admitting: Certified Nurse Midwife

## 2019-11-23 ENCOUNTER — Other Ambulatory Visit: Payer: Self-pay

## 2019-11-23 ENCOUNTER — Other Ambulatory Visit: Payer: Medicaid Other

## 2019-11-23 ENCOUNTER — Ambulatory Visit (INDEPENDENT_AMBULATORY_CARE_PROVIDER_SITE_OTHER): Payer: Medicaid Other | Admitting: Certified Nurse Midwife

## 2019-11-23 VITALS — Wt 198.3 lb

## 2019-11-23 DIAGNOSIS — Z3483 Encounter for supervision of other normal pregnancy, third trimester: Secondary | ICD-10-CM

## 2019-11-23 DIAGNOSIS — O288 Other abnormal findings on antenatal screening of mother: Secondary | ICD-10-CM | POA: Diagnosis not present

## 2019-11-23 LAB — POCT URINALYSIS DIPSTICK OB
Bilirubin, UA: NEGATIVE
Blood, UA: NEGATIVE
Leukocytes, UA: NEGATIVE
Nitrite, UA: NEGATIVE
POC,PROTEIN,UA: NEGATIVE
Spec Grav, UA: >=1.03 — AB (ref 1.010–1.025)
Urobilinogen, UA: 0.2 E.U./dL
pH, UA: 5 (ref 5.0–8.0)

## 2019-11-23 MED ORDER — GLYBURIDE 5 MG PO TABS: 7.5000 mg | ORAL_TABLET | Freq: Every day | ORAL | 3 refills | Status: DC

## 2019-11-23 MED ORDER — GLYBURIDE 5 MG PO TABS: 5.0000 mg | ORAL_TABLET | Freq: Every evening | ORAL | 3 refills | Status: DC

## 2019-11-23 NOTE — Progress Notes (Signed)
ROB, NST, BPP for type 2 diabetes & gestational hypertension. BS log reviewed. 2 hr PP ( after lunch elevated) consult with Dr. Valentino Saxon -morning dose glyburide increased to 7.5 mg. Copy of log scanned into chart. BP today 140-130/80-90. She complains of some headaches that mostly resolve with tylenol. Repeat PIH labs today, negative clonus , +1 reflexes, no swelling, no epigastric pain. BPP 8/8 , NST reactive. U/s reviwed with pt AFI increased to 11.2. Follow up 1 wk for NST with [facility].   Doreene Burke, CNM   Patient Name: Janice Kaufman DOB: 06-29-1991 MRN: [id number] ULTRASOUND REPORT  Location: [facility] Date of Service: 11/23/2019   Indications: Biophysical Profile performed for indication of Diabetes Type 2  Findings:  Mason Jim intrauterine pregnancy is visualized with FHR at 161 BPM.    Fetal presentation is Cephalic.  Placenta: posterior. Grade: 2 AFI: 11.2 cm  BPP Scoring: Movement: 2/2  Tone: 2/2  Breathing: 2/2  AFI: 2/2  Impression: 1. [redacted]w[redacted]d Viable Singleton Intrauterine pregnancy dated by previously established criteria. 2. AFI is 11.2 cm.  3. BPP is 8/8  Recommendations: 1.Clinical correlation with the patient's History and Physical Exam.   Jenine  M. Marciano Sequin     RDMS

## 2019-11-23 NOTE — Patient Instructions (Signed)
Group B Streptococcus Infection During Pregnancy °Group B Streptococcus (GBS) is a type of bacteria that is often found in healthy people. It is commonly found in the rectum, vagina, and intestines. In people who are healthy and not pregnant, the bacteria rarely cause serious illness or complications. However, women who test positive for GBS during pregnancy can pass the bacteria to the baby during childbirth. This can cause serious infection in the baby after birth. °Women with GBS may also have infections during their pregnancy or soon after childbirth. The infections include urinary tract infections (UTIs) or infections of the uterus. GBS also increases a woman's risk of complications during pregnancy, such as early labor or delivery, miscarriage, or stillbirth. Routine testing for GBS is recommended for all pregnant women. °What are the causes? °This condition is caused by bacteria called Streptococcus agalactiae. °What increases the risk? °You may have a higher risk for GBS infection during pregnancy if you had one during a past pregnancy. °What are the signs or symptoms? °In most cases, GBS infection does not cause symptoms in pregnant women. If symptoms exist, they may include: °· Labor that starts before the 37th week of pregnancy. °· A UTI or bladder infection. This may cause a fever, frequent urination, or pain and burning during urination. °· Fever during labor. There can also be a rapid heartbeat in the mother or baby. °Rare but serious symptoms of a GBS infection in women include: °· Blood infection (septicemia). This may cause fever, chills, or confusion. °· Lung infection (pneumonia). This may cause fever, chills, cough, rapid breathing, chest pain, or difficulty breathing. °· Bone, joint, skin, or soft tissue infection. °How is this diagnosed? °You may be screened for GBS between week 35 and week 37 of pregnancy. If you have symptoms of preterm labor, you may be screened earlier. This condition is  diagnosed based on lab test results from: °· A swab of fluid from the vagina and rectum. °· A urine sample. °How is this treated? °This condition is treated with antibiotic medicine. Antibiotic medicine may be given: °· To you when you go into labor, or as soon as your water breaks. The medicines will continue until after you give birth. If you are having a cesarean delivery, you do not need antibiotics unless your water has broken. °· To your baby, if he or she requires treatment. Your health care provider will check your baby to decide if he or she needs antibiotics to prevent a serious infection. °Follow these instructions at home: °· Take over-the-counter and prescription medicines only as told by your health care provider. °· Take your antibiotic medicine as told by your health care provider. Do not stop taking the antibiotic even if you start to feel better. °· Keep all pre-birth (prenatal) visits and follow-up visits as told by your health care provider. This is important. °Contact a health care provider if: °· You have pain or burning when you urinate. °· You have to urinate more often than usual. °· You have a fever or chills. °· You develop a bad-smelling vaginal discharge. °Get help right away if: °· Your water breaks. °· You go into labor. °· You have severe pain in your abdomen. °· You have difficulty breathing. °· You have chest pain. °These symptoms may represent a serious problem that is an emergency. Do not wait to see if the symptoms will go away. Get medical help right away. Call your local emergency services (911 in the [place] Do not drive yourself to   the hospital. °Summary °· GBS is a type of bacteria that is common in healthy people. °· During pregnancy, colonization with GBS can cause serious complications for you or your baby. °· Your health care provider will screen you between 35 and 37 weeks of pregnancy to determine if you are colonized with GBS. °· If you are colonized with GBS during  pregnancy, your health care provider will recommend antibiotics through an IV during labor. °· After delivery, your baby will be evaluated for complications related to potential GBS infection and may require antibiotics to prevent a serious infection. °This information is not intended to replace advice given to you by your health care provider. Make sure you discuss any questions you have with your health care provider. °Document Revised: 03/28/2019 Document Reviewed: 03/28/2019 °Elsevier Patient Education © 2020 Elsevier Inc. ° °

## 2019-11-24 LAB — COMPREHENSIVE METABOLIC PANEL
ALT: 5 IU/L (ref 0–32)
AST: 9 IU/L (ref 0–40)
Albumin/Globulin Ratio: 1.4 (ref 1.2–2.2)
Albumin: 3.4 g/dL — ABNORMAL LOW (ref 3.9–5.0)
Alkaline Phosphatase: 135 IU/L — ABNORMAL HIGH (ref 39–117)
BUN/Creatinine Ratio: 12 (ref 9–23)
BUN: 7 mg/dL (ref 6–20)
Bilirubin Total: 0.3 mg/dL (ref 0.0–1.2)
CO2: 19 mmol/L — ABNORMAL LOW (ref 20–29)
Calcium: 9.5 mg/dL (ref 8.7–10.2)
Chloride: 106 mmol/L (ref 96–106)
Creatinine, Ser: 0.58 mg/dL (ref 0.57–1.00)
GFR calc Af Amer: 147 mL/min/{1.73_m2} (ref >59)
GFR calc non Af Amer: 128 mL/min/{1.73_m2} (ref >59)
Globulin, Total: 2.4 g/dL (ref 1.5–4.5)
Glucose: 90 mg/dL (ref 65–99)
Potassium: 3.9 mmol/L (ref 3.5–5.2)
Sodium: 139 mmol/L (ref 134–144)
Total Protein: 5.8 g/dL — ABNORMAL LOW (ref 6.0–8.5)

## 2019-11-24 LAB — CBC WITH DIFFERENTIAL/PLATELET
Basophils Absolute: 0 10*3/uL (ref 0.0–0.2)
Basos: 0 %
EOS (ABSOLUTE): 0.1 10*3/uL (ref 0.0–0.4)
Eos: 1 %
Hematocrit: 39.4 % (ref 34.0–46.6)
Hemoglobin: 13.6 g/dL (ref 11.1–15.9)
Immature Grans (Abs): 0.1 10*3/uL (ref 0.0–0.1)
Immature Granulocytes: 1 %
Lymphocytes Absolute: 2.7 10*3/uL (ref 0.7–3.1)
Lymphs: 19 %
MCH: 29.3 pg (ref 26.6–33.0)
MCHC: 34.5 g/dL (ref 31.5–35.7)
MCV: 85 fL (ref 79–97)
Monocytes Absolute: 0.8 10*3/uL (ref 0.1–0.9)
Monocytes: 5 %
Neutrophils Absolute: 10.4 10*3/uL — ABNORMAL HIGH (ref 1.4–7.0)
Neutrophils: 74 %
Platelets: 235 10*3/uL (ref 150–450)
RBC: 4.64 x10E6/uL (ref 3.77–5.28)
RDW: 12.9 % (ref 11.7–15.4)
WBC: 14.2 10*3/uL — ABNORMAL HIGH (ref 3.4–10.8)

## 2019-11-24 LAB — PROTEIN / CREATININE RATIO, URINE
Creatinine, Urine: 221.1 mg/dL
Protein, Ur: 160.7 mg/dL
Protein/Creat Ratio: 727 mg/g creat — ABNORMAL HIGH (ref 0–200)

## 2019-11-26 ENCOUNTER — Other Ambulatory Visit (INDEPENDENT_AMBULATORY_CARE_PROVIDER_SITE_OTHER): Payer: Medicaid Other | Admitting: Certified Nurse Midwife

## 2019-11-26 DIAGNOSIS — F17200 Nicotine dependence, unspecified, uncomplicated: Secondary | ICD-10-CM

## 2019-11-26 DIAGNOSIS — E1165 Type 2 diabetes mellitus with hyperglycemia: Secondary | ICD-10-CM

## 2019-11-26 DIAGNOSIS — Z8632 Personal history of gestational diabetes: Secondary | ICD-10-CM

## 2019-11-26 DIAGNOSIS — Z8759 Personal history of other complications of pregnancy, childbirth and the puerperium: Secondary | ICD-10-CM

## 2019-11-26 DIAGNOSIS — Z3A35 35 weeks gestation of pregnancy: Secondary | ICD-10-CM

## 2019-11-26 DIAGNOSIS — O0993 Supervision of high risk pregnancy, unspecified, third trimester: Secondary | ICD-10-CM

## 2019-11-26 NOTE — Progress Notes (Signed)
Referral placed for MFM due to Type 2 diabetes and pre-eclampsia without severe features in pregnancy. Needs fetal echocardiogram.    Gunnar Bulla, CNM [facility], [facility] 11/26/19 4:33 PM

## 2019-12-01 ENCOUNTER — Encounter: Payer: Self-pay | Admitting: Obstetrics and Gynecology

## 2019-12-01 ENCOUNTER — Telehealth: Payer: Self-pay | Admitting: Certified Nurse Midwife

## 2019-12-01 ENCOUNTER — Inpatient Hospital Stay: Payer: Medicaid Other | Admitting: Anesthesiology

## 2019-12-01 ENCOUNTER — Encounter: Payer: Medicaid Other | Admitting: Certified Nurse Midwife

## 2019-12-01 ENCOUNTER — Other Ambulatory Visit: Payer: Medicaid Other

## 2019-12-01 ENCOUNTER — Other Ambulatory Visit: Payer: Self-pay

## 2019-12-01 ENCOUNTER — Inpatient Hospital Stay
Admission: EM | Admit: 2019-12-01 | Discharge: 2019-12-03 | DRG: 798 | Disposition: A | Discharge status: Home / Self Care | Payer: Medicaid Other | Attending: Certified Nurse Midwife | Admitting: Certified Nurse Midwife

## 2019-12-01 DIAGNOSIS — F17210 Nicotine dependence, cigarettes, uncomplicated: Secondary | ICD-10-CM | POA: Diagnosis present

## 2019-12-01 DIAGNOSIS — Z302 Encounter for sterilization: Secondary | ICD-10-CM

## 2019-12-01 DIAGNOSIS — E119 Type 2 diabetes mellitus without complications: Secondary | ICD-10-CM | POA: Diagnosis present

## 2019-12-01 DIAGNOSIS — O1404 Mild to moderate pre-eclampsia, complicating childbirth: Secondary | ICD-10-CM | POA: Diagnosis present

## 2019-12-01 DIAGNOSIS — O134 Gestational [pregnancy-induced] hypertension without significant proteinuria, complicating childbirth: Secondary | ICD-10-CM | POA: Diagnosis not present

## 2019-12-01 DIAGNOSIS — O42913 Preterm premature rupture of membranes, unspecified as to length of time between rupture and onset of labor, third trimester: Secondary | ICD-10-CM | POA: Diagnosis present

## 2019-12-01 DIAGNOSIS — Z7984 Long term (current) use of oral hypoglycemic drugs: Secondary | ICD-10-CM | POA: Diagnosis not present

## 2019-12-01 DIAGNOSIS — O2412 Pre-existing diabetes mellitus, type 2, in childbirth: Secondary | ICD-10-CM | POA: Diagnosis present

## 2019-12-01 DIAGNOSIS — Z3A35 35 weeks gestation of pregnancy: Secondary | ICD-10-CM | POA: Diagnosis not present

## 2019-12-01 DIAGNOSIS — O99334 Smoking (tobacco) complicating childbirth: Secondary | ICD-10-CM | POA: Diagnosis present

## 2019-12-01 DIAGNOSIS — Z20822 Contact with and (suspected) exposure to covid-19: Secondary | ICD-10-CM | POA: Diagnosis present

## 2019-12-01 LAB — RESPIRATORY PANEL BY RT PCR (FLU A&B, COVID)
Influenza A by PCR: NEGATIVE
Influenza B by PCR: NEGATIVE
SARS Coronavirus 2 by RT PCR: NEGATIVE

## 2019-12-01 LAB — COMPREHENSIVE METABOLIC PANEL
ALT: 6 U/L (ref 0–44)
AST: 10 U/L — ABNORMAL LOW (ref 15–41)
Albumin: 2.6 g/dL — ABNORMAL LOW (ref 3.5–5.0)
Alkaline Phosphatase: 120 U/L (ref 38–126)
Anion gap: 10 (ref 5–15)
BUN: 8 mg/dL (ref 6–20)
CO2: 20 mmol/L — ABNORMAL LOW (ref 22–32)
Calcium: 8.6 mg/dL — ABNORMAL LOW (ref 8.9–10.3)
Chloride: 106 mmol/L (ref 98–111)
Creatinine, Ser: 0.44 mg/dL (ref 0.44–1.00)
GFR calc Af Amer: >60 mL/min (ref >60)
GFR calc non Af Amer: >60 mL/min (ref >60)
Glucose, Bld: 107 mg/dL — ABNORMAL HIGH (ref 70–99)
Potassium: 3.7 mmol/L (ref 3.5–5.1)
Sodium: 136 mmol/L (ref 135–145)
Total Bilirubin: 0.5 mg/dL (ref 0.3–1.2)
Total Protein: 6.1 g/dL — ABNORMAL LOW (ref 6.5–8.1)

## 2019-12-01 LAB — CBC
HCT: 37.6 % (ref 36.0–46.0)
Hemoglobin: 13.1 g/dL (ref 12.0–15.0)
MCH: 29.4 pg (ref 26.0–34.0)
MCHC: 34.8 g/dL (ref 30.0–36.0)
MCV: 84.3 fL (ref 80.0–100.0)
Platelets: 272 10*3/uL (ref 150–400)
RBC: 4.46 MIL/uL (ref 3.87–5.11)
RDW: 12.7 % (ref 11.5–15.5)
WBC: 17.2 10*3/uL — ABNORMAL HIGH (ref 4.0–10.5)
nRBC: 0 % (ref 0.0–0.2)

## 2019-12-01 LAB — PROTEIN / CREATININE RATIO, URINE
Creatinine, Urine: 191 mg/dL
Protein Creatinine Ratio: 0.61 mg/mg{Cre} — ABNORMAL HIGH (ref 0.00–0.15)
Total Protein, Urine: 116 mg/dL

## 2019-12-01 LAB — TYPE AND SCREEN
ABO/RH(D): O POS
Antibody Screen: NEGATIVE

## 2019-12-01 LAB — GLUCOSE, CAPILLARY
Glucose-Capillary: 107 mg/dL — ABNORMAL HIGH (ref 70–99)
Glucose-Capillary: 126 mg/dL — ABNORMAL HIGH (ref 70–99)
Glucose-Capillary: 85 mg/dL (ref 70–99)
Glucose-Capillary: 152 mg/dL — ABNORMAL HIGH (ref 70–99)

## 2019-12-01 LAB — RUPTURE OF MEMBRANE (ROM)PLUS: Rom Plus: POSITIVE

## 2019-12-01 LAB — GROUP B STREP BY PCR: Group B strep by PCR: NEGATIVE

## 2019-12-01 LAB — RPR: RPR Ser Ql: NONREACTIVE

## 2019-12-01 MED ORDER — METOCLOPRAMIDE HCL 10 MG PO TABS
10.0000 mg | ORAL_TABLET | Freq: Once | ORAL | Status: AC
  Administered 2019-12-02: 10 mg via ORAL
  Filled 2019-12-01: qty 1

## 2019-12-01 MED ORDER — GLYBURIDE 5 MG PO TABS
7.5000 mg | ORAL_TABLET | Freq: Every day | ORAL | Status: DC
  Administered 2019-12-03: 7.5 mg via ORAL
  Filled 2019-12-01 (×2): qty 1

## 2019-12-01 MED ORDER — WITCH HAZEL-GLYCERIN EX PADS: 1.0000 "application " | MEDICATED_PAD | CUTANEOUS | Status: DC | PRN

## 2019-12-01 MED ORDER — BENZOCAINE-MENTHOL 20-0.5 % EX AERO: 1.0000 "application " | INHALATION_SPRAY | CUTANEOUS | Status: DC | PRN

## 2019-12-01 MED ORDER — SODIUM CHLORIDE 0.9 % IV SOLN: 250.0000 mL | INTRAVENOUS | Status: DC | PRN

## 2019-12-01 MED ORDER — NIFEDIPINE 10 MG PO CAPS
ORAL_CAPSULE | ORAL | Status: AC
  Filled 2019-12-01: qty 1

## 2019-12-01 MED ORDER — PHENYLEPHRINE 40 MCG/ML (10ML) SYRINGE FOR IV PUSH (FOR BLOOD PRESSURE SUPPORT): 80.0000 ug | PREFILLED_SYRINGE | INTRAVENOUS | Status: DC | PRN

## 2019-12-01 MED ORDER — FENTANYL 2.5 MCG/ML W/ROPIVACAINE 0.15% IN NS 100 ML EPIDURAL (ARMC)
EPIDURAL | Status: AC
  Filled 2019-12-01: qty 100

## 2019-12-01 MED ORDER — DIPHENHYDRAMINE HCL 25 MG PO CAPS: 25.0000 mg | ORAL_CAPSULE | Freq: Four times a day (QID) | ORAL | Status: DC | PRN

## 2019-12-01 MED ORDER — MISOPROSTOL 50MCG HALF TABLET
50.0000 ug | ORAL_TABLET | ORAL | Status: DC
  Filled 2019-12-01: qty 1

## 2019-12-01 MED ORDER — OXYTOCIN 40 UNITS IN NORMAL SALINE INFUSION - SIMPLE MED
2.5000 [IU]/h | INTRAVENOUS | Status: DC
  Administered 2019-12-01: 2.5 [IU]/h via INTRAVENOUS
  Filled 2019-12-01: qty 1000

## 2019-12-01 MED ORDER — LACTATED RINGERS IV SOLN: INTRAVENOUS | Status: DC

## 2019-12-01 MED ORDER — FAMOTIDINE 20 MG PO TABS
40.0000 mg | ORAL_TABLET | Freq: Once | ORAL | Status: AC
  Administered 2019-12-02: 40 mg via ORAL
  Filled 2019-12-01: qty 2

## 2019-12-01 MED ORDER — MISOPROSTOL 200 MCG PO TABS
ORAL_TABLET | ORAL | Status: AC
  Filled 2019-12-01: qty 4

## 2019-12-01 MED ORDER — LIDOCAINE-EPINEPHRINE (PF) 1.5 %-1:200000 IJ SOLN
INTRAMUSCULAR | Status: DC | PRN
  Administered 2019-12-01: 3 mL via EPIDURAL

## 2019-12-01 MED ORDER — LIDOCAINE HCL (PF) 2 % IJ SOLN
INTRAMUSCULAR | Status: DC | PRN
  Administered 2019-12-01: 100 mg via EPIDURAL

## 2019-12-01 MED ORDER — EPHEDRINE 5 MG/ML INJ: 10.0000 mg | INTRAVENOUS | Status: DC | PRN

## 2019-12-01 MED ORDER — DIBUCAINE (PERIANAL) 1 % EX OINT: 1.0000 "application " | TOPICAL_OINTMENT | CUTANEOUS | Status: DC | PRN

## 2019-12-01 MED ORDER — ONDANSETRON HCL 4 MG PO TABS: 4.0000 mg | ORAL_TABLET | ORAL | Status: DC | PRN

## 2019-12-01 MED ORDER — NIFEDIPINE 10 MG PO CAPS
10.0000 mg | ORAL_CAPSULE | ORAL | Status: DC | PRN
  Administered 2019-12-01 (×2): 10 mg via ORAL
  Filled 2019-12-01 (×2): qty 1

## 2019-12-01 MED ORDER — TERBUTALINE SULFATE 1 MG/ML IJ SOLN: 0.2500 mg | Freq: Once | INTRAMUSCULAR | Status: DC | PRN

## 2019-12-01 MED ORDER — NIFEDIPINE 10 MG PO CAPS
20.0000 mg | ORAL_CAPSULE | ORAL | Status: DC | PRN
  Filled 2019-12-01: qty 2

## 2019-12-01 MED ORDER — IBUPROFEN 600 MG PO TABS
600.0000 mg | ORAL_TABLET | Freq: Four times a day (QID) | ORAL | Status: DC
  Administered 2019-12-01 – 2019-12-03 (×6): 600 mg via ORAL
  Filled 2019-12-01 (×6): qty 1

## 2019-12-01 MED ORDER — LIDOCAINE HCL (PF) 1 % IJ SOLN
INTRAMUSCULAR | Status: DC | PRN
  Administered 2019-12-01: 3 mL via SUBCUTANEOUS

## 2019-12-01 MED ORDER — ACETAMINOPHEN 325 MG PO TABS
650.0000 mg | ORAL_TABLET | ORAL | Status: DC | PRN
  Administered 2019-12-02 – 2019-12-03 (×3): 650 mg via ORAL
  Filled 2019-12-01 (×3): qty 2

## 2019-12-01 MED ORDER — GLYBURIDE 5 MG PO TABS
5.0000 mg | ORAL_TABLET | Freq: Every evening | ORAL | Status: DC
  Administered 2019-12-01 – 2019-12-02 (×2): 5 mg via ORAL
  Filled 2019-12-01 (×3): qty 1

## 2019-12-01 MED ORDER — AMMONIA AROMATIC IN INHA
RESPIRATORY_TRACT | Status: AC
  Filled 2019-12-01: qty 10

## 2019-12-01 MED ORDER — OXYTOCIN 10 UNIT/ML IJ SOLN
INTRAMUSCULAR | Status: AC
  Filled 2019-12-01: qty 2

## 2019-12-01 MED ORDER — LIDOCAINE HCL (PF) 1 % IJ SOLN
30.0000 mL | INTRAMUSCULAR | Status: DC | PRN
  Filled 2019-12-01: qty 30

## 2019-12-01 MED ORDER — BUPIVACAINE HCL (PF) 0.25 % IJ SOLN
INTRAMUSCULAR | Status: DC | PRN
  Administered 2019-12-01 (×3): 4 mL via EPIDURAL

## 2019-12-01 MED ORDER — SODIUM CHLORIDE 0.9% FLUSH: 3.0000 mL | INTRAVENOUS | Status: DC | PRN

## 2019-12-01 MED ORDER — SOD CITRATE-CITRIC ACID 500-334 MG/5ML PO SOLN: 30.0000 mL | ORAL | Status: DC | PRN

## 2019-12-01 MED ORDER — SIMETHICONE 80 MG PO CHEW: 80.0000 mg | CHEWABLE_TABLET | ORAL | Status: DC | PRN

## 2019-12-01 MED ORDER — OXYTOCIN BOLUS FROM INFUSION
500.0000 mL | Freq: Once | INTRAVENOUS | Status: AC
  Administered 2019-12-01: 500 mL via INTRAVENOUS

## 2019-12-01 MED ORDER — ONDANSETRON HCL 4 MG/2ML IJ SOLN: 4.0000 mg | Freq: Four times a day (QID) | INTRAMUSCULAR | Status: DC | PRN

## 2019-12-01 MED ORDER — COCONUT OIL OIL: 1.0000 "application " | TOPICAL_OIL | Status: DC | PRN

## 2019-12-01 MED ORDER — DIPHENHYDRAMINE HCL 50 MG/ML IJ SOLN: 12.5000 mg | INTRAMUSCULAR | Status: DC | PRN

## 2019-12-01 MED ORDER — NIFEDIPINE ER OSMOTIC RELEASE 30 MG PO TB24: 30.0000 mg | ORAL_TABLET | Freq: Two times a day (BID) | ORAL | Status: DC

## 2019-12-01 MED ORDER — NIFEDIPINE ER OSMOTIC RELEASE 30 MG PO TB24
30.0000 mg | ORAL_TABLET | Freq: Two times a day (BID) | ORAL | Status: DC
  Administered 2019-12-01 – 2019-12-03 (×4): 30 mg via ORAL
  Filled 2019-12-01 (×4): qty 1

## 2019-12-01 MED ORDER — SENNOSIDES-DOCUSATE SODIUM 8.6-50 MG PO TABS
2.0000 | ORAL_TABLET | ORAL | Status: DC
  Administered 2019-12-02 (×2): 2 via ORAL
  Filled 2019-12-01 (×2): qty 2

## 2019-12-01 MED ORDER — LACTATED RINGERS IV SOLN: 500.0000 mL | Freq: Once | INTRAVENOUS | Status: DC

## 2019-12-01 MED ORDER — BUTORPHANOL TARTRATE 1 MG/ML IJ SOLN: 1.0000 mg | INTRAMUSCULAR | Status: DC | PRN

## 2019-12-01 MED ORDER — ONDANSETRON HCL 4 MG/2ML IJ SOLN: 4.0000 mg | INTRAMUSCULAR | Status: DC | PRN

## 2019-12-01 MED ORDER — PRENATAL MULTIVITAMIN CH
1.0000 | ORAL_TABLET | Freq: Every day | ORAL | Status: DC
  Administered 2019-12-03: 1 via ORAL
  Filled 2019-12-01 (×2): qty 1

## 2019-12-01 MED ORDER — FENTANYL 2.5 MCG/ML W/ROPIVACAINE 0.15% IN NS 100 ML EPIDURAL (ARMC)
12.0000 mL/h | EPIDURAL | Status: DC
  Administered 2019-12-01: 12 mL/h via EPIDURAL

## 2019-12-01 MED ORDER — LACTATED RINGERS IV SOLN: 500.0000 mL | INTRAVENOUS | Status: DC | PRN

## 2019-12-01 MED ORDER — SODIUM CHLORIDE 0.9% FLUSH
3.0000 mL | Freq: Two times a day (BID) | INTRAVENOUS | Status: DC
  Administered 2019-12-02: 3 mL via INTRAVENOUS

## 2019-12-01 MED ORDER — IBUPROFEN 600 MG PO TABS
ORAL_TABLET | ORAL | Status: AC
  Filled 2019-12-01: qty 1

## 2019-12-01 MED ORDER — LABETALOL HCL 5 MG/ML IV SOLN: 40.0000 mg | INTRAVENOUS | Status: DC | PRN

## 2019-12-01 MED ORDER — ACETAMINOPHEN 325 MG PO TABS: 650.0000 mg | ORAL_TABLET | ORAL | Status: DC | PRN

## 2019-12-01 NOTE — Progress Notes (Addendum)
CSW acknowledges consult for drug exposed newborn. CSW spoke with RN Jessica. No urgent needs reported. CSW will complete full assessment in the morning since MOB just delivered.   CSW inquired with MD about consult as MOB drug screen was negative.   3/19- Consult now for late PNC and [place] score. CSW will follow-up today.    Zykiria Bruening, LCSW [phone]  

## 2019-12-01 NOTE — Anesthesia Preprocedure Evaluation (Signed)
Anesthesia Evaluation  Patient identified by MRN, date of birth, ID band Patient awake    Reviewed: Allergy & Precautions, NPO status , Patient's Chart, lab work & pertinent test results  History of Anesthesia Complications Negative for: history of anesthetic complications  Airway Mallampati: II       Dental   Pulmonary Current Smoker,           Cardiovascular      Neuro/Psych PSYCHIATRIC DISORDERS Anxiety Depression    GI/Hepatic   Endo/Other  diabetes, Well Controlled  Renal/GU      Musculoskeletal   Abdominal   Peds  Hematology   Anesthesia Other Findings   Reproductive/Obstetrics                             Anesthesia Physical Anesthesia Plan  ASA: II  Anesthesia Plan: Epidural   Post-op Pain Management:    Induction:   PONV Risk Score and Plan:   Airway Management Planned:   Additional Equipment:   Intra-op Plan:   Post-operative Plan:   Informed Consent:   Plan Discussed with: Anesthesiologist  Anesthesia Plan Comments:         Anesthesia Quick Evaluation

## 2019-12-01 NOTE — Lactation Note (Signed)
This note was copied from a baby's chart. Lactation Consultation Note  Patient Name: Janice Kaufman [id number] Date: 12/01/2019 Reason for consult: Follow-up assessment;Late-preterm 34-36.6wks(low BS)  Baby's BS levels improved after bottle feeding, however returned to below 40, 2hrs post feed. Baby taken to [facility].  LC discussed with mom stimulation options, mom opting for DEBP.  LC set-up, reviewed use, cleaning, milk storage and transport. Mom pumped for 15 minutes with size 68mm flanges, feeling comfortable. Colostrum present on breastshield after removal. LC took to nursery where droplets were swabs on inside of baby's cheeks.  Mom instructed to pump routinely every 3 hours when baby is in SCN, and in addition to putting baby to breast to encourage a plentiful milk supply. Discussed normal course of lactation, and milk supply and demand.   Maternal Data Formula Feeding for Exclusion: No Has patient been taught Hand Expression?: Yes Does the patient have breastfeeding experience prior to this delivery?: No  Feeding Feeding Type: Bottle Fed - Formula Nipple Type: Slow - flow  LATCH Score Latch: Repeated attempts needed to sustain latch, nipple held in mouth throughout feeding, stimulation needed to elicit sucking reflex.  Audible Swallowing: None  Type of Nipple: Everted at rest and after stimulation  Comfort (Breast/Nipple): Soft / non-tender(flaccid tissue; no breast change during pregnancy)  Hold (Positioning): Assistance needed to correctly position infant at breast and maintain latch.  LATCH Score: 6  Interventions Interventions: Breast feeding basics reviewed;Assisted with latch;Hand express;Breast compression;Adjust position;Support pillows  Lactation Tools Discussed/Used Pump Review: Setup, frequency, and cleaning;Milk Storage Initiated by:: Mickle Mallory, MPH, IBCLC Date initiated:: 12/01/19   Consult Status Consult Status: Follow-up Date:  12/01/19 Follow-up type: In-patient    Danford Bad 12/01/2019, 2:47 PM

## 2019-12-01 NOTE — Telephone Encounter (Signed)
Called pt couldn't lmtrc for her next two visits her 2 week televisit and her 6 week ppv with michelle.

## 2019-12-01 NOTE — Progress Notes (Addendum)
Inpatient Diabetes Program Recommendations  Inpatient Diabetes Program Recommendations  Diabetes Treatment Program Recommendations  ADA Standards of Care 2018 Diabetes in Pregnancy Target Glucose Ranges:  Fasting: 60 - 90 mg/dL Preprandial: 60 - 876 mg/dL 1 hr postprandial: Less than 140mg /dL (from first bite of meal) 2 hr postprandial: Less than 120 mg/dL (from first bite of meal)      Lab Results  Component Value Date   GLUCAP 107 (H) 12/01/2019   HGBA1C 6.2 (H) 09/23/2019    Review of Glycemic Control  Diabetes history: DM2 Outpatient Diabetes medications: Glyburide 7.5 mg q am + 5 mg pm Current orders for Inpatient glycemic control: Pregnant Order Set  Inpatient Diabetes Program Recommendations:   Noted fasting CBG 107 with patient on clear liquids only  If CBG > 120, consider Pregnant correction scale order set . Will follow during hospitalization.  Thank you, 11/21/2019. Eleonora Peeler, RN, MSN, CDE  Diabetes Coordinator Inpatient Glycemic Control Team Team Pager [phone] (8am-5pm) 12/01/2019 11:06 AM

## 2019-12-01 NOTE — Progress Notes (Signed)
Discussed POC with pt getting BTL with Dr. Logan Bores. BTL scheduled for 3pm tomorrow. Pt must be npo after 7am tomorrow morning. Pt should hold off on glyburide tomorrow am.

## 2019-12-01 NOTE — Lactation Note (Signed)
This note was copied from a baby's chart. Lactation Consultation Note  Patient Name: Janice Kaufman [id number] Date: 12/01/2019 Reason for consult: Initial assessment;Late-preterm 34-36.6wks(Mom type2DM; low BS)  LC called to assist mom with first feeding after delivery. Mom is G5P3 and 35+[redacted] weeks gestation. Mom has history of GDM with 3rd baby, and is now Type 2 DM. Mom reports attempted BF with other 3 children, but "always unsuccessful". Mom's breast appear flaccid with erect nipples, and mom notes no changes in her breast/breast tissue during pregnancy. Baby was already placed in football hold at the right breast. LC assisted with a modified sandwich/tea cup hold and taught mom how to pull baby tightly into the breast once she sees a wide open mouth. Baby was grunting lightly throughout the feed, but sustained latch for 15 minutes.  LC demonstrated technique for hand expression on opposite breast and on breast she fed on after baby released; several attempts made to express milk through hand expression, no visible drops. Transition RN took baby for weight, measures, and other post delivery checks including BS. Transition with update LC re: BS levels. LC spoke with mom briefly about additional breast stimulation, milk removal, and alternate feeding methods for baby if the BS level was low. Reviewed milk supply and demand, normal course of lactation, early feeding cues, newborn stomach size and feeding patterns. Mom hopeful to breastfeed this time with [name].  Maternal Data Formula Feeding for Exclusion: No Has patient been taught Hand Expression?: Yes Does the patient have breastfeeding experience prior to this delivery?: No(unsuccessful BF attempts w/ other children)  Feeding Feeding Type: Breast Fed  LATCH Score Latch: Repeated attempts needed to sustain latch, nipple held in mouth throughout feeding, stimulation needed to elicit sucking reflex.  Audible Swallowing: None  Type of  Nipple: Everted at rest and after stimulation  Comfort (Breast/Nipple): Soft / non-tender(flaccid tissue; no breast change during pregnancy)  Hold (Positioning): Assistance needed to correctly position infant at breast and maintain latch.  LATCH Score: 6  Interventions Interventions: Breast feeding basics reviewed;Assisted with latch;Hand express;Breast compression;Adjust position;Support pillows  Lactation Tools Discussed/Used     Consult Status Consult Status: Follow-up Date: 12/01/19 Follow-up type: In-patient    Danford Bad 12/01/2019, 12:55 PM

## 2019-12-01 NOTE — Progress Notes (Signed)
CNM called after pt had two severe range BP's. Hypertension protocol orders not in place. Anesthesia on unit to place pts epidural. Per CNM hold off on giving prn blood pressure meds till after epidural. CNM will put in protocol orders if still needed. Will continue to monitor

## 2019-12-01 NOTE — H&P (Signed)
Obstetric History and Physical  Janice Kaufman is a 29 y.o. [phone] with IUP at [redacted]w[redacted]d presenting with spontaneous rupture of membranes early this morning.   Patient states she has been having regular contractions every four (4) to five (5) minutes,  none vaginal bleeding, ruptured, clear fluid membranes, with active fetal movement.    Denies difficulty breathing or respiratory distress, chest pain, dysuria, and leg pain or swelling.   Prenatal Course  Source of Care: [facility]-initial visit at 26 weeks, total visits: 6  Pregnancy complications or risks: Late entry of care, Pre-eclampsia, Smoker, Type 2 Diabetes, Rubella non-immune  Prenatal labs and studies:  ABO, Rh: --/--/O POS (03/18 0348)  Antibody: NEG (03/18 0348)  Rubella: 0.99 (01/08 1103)  Varicella: 192 (01/08 1103)  RPR: NON REACTIVE (03/18 0348)   HBsAg: Negative (01/08 1103)   HIV: Non Reactive (01/08 1103)   GBS:--/NEGATIVE (03/18 0348)  Genetic screening: Declined  Anatomy US: Complete, normal (12/29)  Past Medical History:  Diagnosis Date  . Abnormal Pap smear of cervix   . Anxiety   . Depression   . Diabetes mellitus ([place])    Type 2  . History of abnormal cervical Pap smear   . History of gestational hypertension   . History of pre-eclampsia   . History of urinary tract infection     Past Surgical History:  Procedure Laterality Date  . COLPOSCOPY    . HEMORRHOID SURGERY N/A 10/26/2019   Procedure: HEMORRHOIDECTOMY;  Surgeon: Robert Bellow, MD;  Location: [facility] ORS;  Service: General;  Laterality: N/A;    OB History  Gravida Para Term Preterm AB Living  5 4 3 1 1 4   SAB TAB Ectopic Multiple Live Births  1     0 4    # Outcome Date GA Lbr Len/2nd Weight Sex Delivery Anes PTL Lv  5 Preterm 12/01/19 [redacted]w[redacted]d / 10:00  F Vag-Spont EPI  LIV  4 Term 2018   3374 g M Vag-Spont   LIV  3 SAB 2016 [redacted]w[redacted]d         2 Term 2013   3827 g M Vag-Spont   LIV  1 Term 2012   2807 g F Vag-Spont   LIV     Social History   Socioeconomic History  . Marital status: Legally Separated    Spouse name: Not on file  . Number of children: Not on file  . Years of education: Not on file  . Highest education level: Not on file  Occupational History  . Not on file  Tobacco Use  . Smoking status: Current Every Day Smoker    Packs/day: 0.50    Years: 10.00    Pack years: 5.00    Types: Cigarettes  . Smokeless tobacco: Never Used  Substance and Sexual Activity  . Alcohol use: Not Currently    Comment: Last ETOH use one year ago (in 2019)  . Drug use: Never  . Sexual activity: Yes    Birth control/protection: None    Comment: Last BCM (ocps) used 04/2019  Other Topics Concern  . Not on file  Social History Narrative  . Not on file   Social Determinants of Health   Financial Resource Strain:   . Difficulty of Paying Living Expenses:   Food Insecurity:   . Worried About Charity fundraiser in the Last Year:   . Arboriculturist in the Last Year:   Transportation Needs:   . Film/video editor (Medical):   Marland Kitchen  Lack of Transportation (Non-Medical):   Physical Activity:   . Days of Exercise per Week:   . Minutes of Exercise per Session:   Stress:   . Feeling of Stress :   Social Connections:   . Frequency of Communication with Friends and Family:   . Frequency of Social Gatherings with Friends and Family:   . Attends Religious Services:   . Active Member of Clubs or Organizations:   . Attends Banker Meetings:   Marland Kitchen Marital Status:     Family History  Problem Relation Age of Onset  . Cancer Mother   . Diabetes Mother   . Thyroid disease Mother   . Ovarian cancer Paternal Grandmother   . Diabetes Paternal Grandmother     Medications Prior to Admission  Medication Sig Dispense Refill Last Dose  . Accu-Chek FastClix Lancets MISC 1 Units by Percutaneous route 4 (four) times daily. Test blood sugar 4 times daily. Before breakfast and 2 hours after every meal. 100  each 12 11/30/2019 at Unknown time  . acetaminophen (TYLENOL) 325 MG tablet Take 2 tablets (650 mg total) by mouth every 4 (four) hours as needed (for pain scale < 4  OR  temperature  >/=  100.5 F). 60 tablet 0 11/30/2019 at 1200  . aspirin EC 81 MG tablet Take 1 tablet (81 mg total) by mouth daily. 30 tablet 3 11/30/2019 at Unknown time  . docusate sodium (COLACE) 100 MG capsule Take 100 mg by mouth 2 (two) times daily as needed for mild constipation.   Past Month at Unknown time  . glucose blood test strip Test blood sugar 4 times daily. Before breakfast,2 hours after every meal. 100 each 12 11/30/2019 at Unknown time  . glyBURIDE (DIABETA) 5 MG tablet Take 1.5 tablets (7.5 mg total) by mouth daily with breakfast. 30 tablet 3 11/30/2019 at Unknown time  . glyBURIDE (DIABETA) 5 MG tablet Take 1 tablet (5 mg total) by mouth every evening. 30 tablet 3 11/30/2019 at Unknown time  . Prenatal Vit-Fe Fumarate-FA (MULTIVITAMIN-PRENATAL) 27-0.8 MG TABS tablet Take 1 tablet by mouth daily.    11/30/2019 at Unknown time  . NIFEdipine (PROCARDIA) 10 MG capsule Take 1 capsule (10 mg total) by mouth every 8 (eight) hours. (Patient not taking: Reported on 11/23/2019) 30 capsule 1 Not Taking at Unknown time  . psyllium (METAMUCIL) 58.6 % packet Take 1 packet by mouth daily.   Not Taking at Unknown time    No Known Allergies  Review of Systems: Negative except for what is mentioned in HPI.  Physical Exam:  BP (!) 147/90   Pulse 70   Temp 97.9 F (36.6 C) (Oral)   Resp 17   Ht 5\' 6"  (1.676 m)   Wt 90 kg   LMP 03/18/2019 (Exact Date) Comment: irregular menses  SpO2 98%   Breastfeeding Unknown   BMI 32.01 kg/m   GENERAL: Well-developed, well-nourished female in no acute distress.   LUNGS: Clear to auscultation bilaterally.   HEART: Regular rate and rhythm.  ABDOMEN: Soft, nontender, nondistended, gravid.  EXTREMITIES: Nontender, no edema, 2+ distal pulses.  Cervical Exam: Dilation: 10 Dilation  Complete Date: 12/01/19 Dilation Complete Time: 0100 Effacement (%): 100 Cervical Position: Middle Station: Plus 1 Presentation: Vertex Exam by:: Kempton RN  FHR Category I  Contractions: Every four (4) to five (5) minutes, soft resting tone  Pertinent Labs/Studies:   Results for orders placed or performed during the hospital encounter of 12/01/19 (from the past 24  hour(s))  ROM Plus ([facility] only)     Status: None   Collection Time: 12/01/19  2:57 AM  Result Value Ref Range   Rom Plus POSITIVE   Type and screen [facility]     Status: None   Collection Time: 12/01/19  3:48 AM  Result Value Ref Range   ABO/RH(D) O POS    Antibody Screen NEG    Sample Expiration      12/04/2019,2359 Performed at [facility], [address] [place], [place] [address]   CBC     Status: Abnormal   Collection Time: 12/01/19  3:48 AM  Result Value Ref Range   WBC 17.2 (H) 4.0 - 10.5 K/uL   RBC 4.46 3.87 - 5.11 MIL/uL   Hemoglobin 13.1 12.0 - 15.0 g/dL   HCT 23.5 36.1 - 44.3 %   MCV 84.3 80.0 - 100.0 fL   MCH 29.4 26.0 - 34.0 pg   MCHC 34.8 30.0 - 36.0 g/dL   RDW 15.4 00.8 - 67.6 %   Platelets 272 150 - 400 K/uL   nRBC 0.0 0.0 - 0.2 %  Comprehensive metabolic panel     Status: Abnormal   Collection Time: 12/01/19  3:48 AM  Result Value Ref Range   Sodium 136 135 - 145 mmol/L   Potassium 3.7 3.5 - 5.1 mmol/L   Chloride 106 98 - 111 mmol/L   CO2 20 (L) 22 - 32 mmol/L   Glucose, Bld 107 (H) 70 - 99 mg/dL   BUN 8 6 - 20 mg/dL   Creatinine, Ser 1.95 0.44 - 1.00 mg/dL   Calcium 8.6 (L) 8.9 - 10.3 mg/dL   Total Protein 6.1 (L) 6.5 - 8.1 g/dL   Albumin 2.6 (L) 3.5 - 5.0 g/dL   AST 10 (L) 15 - 41 U/L   ALT 6 0 - 44 U/L   Alkaline Phosphatase 120 38 - 126 U/L   Total Bilirubin 0.5 0.3 - 1.2 mg/dL   GFR calc non Af Amer >60 >60 mL/min   GFR calc Af Amer >60 >60 mL/min   Anion gap 10 5 - 15  Protein / creatinine ratio, urine     Status: Abnormal   Collection Time:  12/01/19  3:48 AM  Result Value Ref Range   Creatinine, Urine 191 mg/dL   Total Protein, Urine 116 mg/dL   Protein Creatinine Ratio 0.61 (H) 0.00 - 0.15 mg/mg[Cre]  Group B strep by PCR     Status: None   Collection Time: 12/01/19  3:48 AM   Specimen: Vaginal/Rectal; Genital  Result Value Ref Range   Group B strep by PCR NEGATIVE NEGATIVE  RPR     Status: None   Collection Time: 12/01/19  3:48 AM  Result Value Ref Range   RPR Ser Ql NON REACTIVE NON REACTIVE  Respiratory Panel by RT PCR (Flu A&B, Covid) - Nasopharyngeal Swab     Status: None   Collection Time: 12/01/19  3:48 AM   Specimen: Nasopharyngeal Swab  Result Value Ref Range   SARS Coronavirus 2 by RT PCR NEGATIVE NEGATIVE   Influenza A by PCR NEGATIVE NEGATIVE   Influenza B by PCR NEGATIVE NEGATIVE  Glucose, capillary     Status: Abnormal   Collection Time: 12/01/19  8:17 AM  Result Value Ref Range   Glucose-Capillary 107 (H) 70 - 99 mg/dL  Glucose, capillary     Status: Abnormal   Collection Time: 12/01/19 11:45 AM  Result Value Ref  Range   Glucose-Capillary 126 (H) 70 - 99 mg/dL    Assessment :  Janice Kaufman is a 29 y.o. [id number] at [redacted]w[redacted]d being admitted for labor, Rh positive, GBS negative, Late entry of care, Pre-eclampsia, Smoker, Type 2 Diabetes, Rubella non-immune  FHR Category I  Plan:  Admit to birthing suites.   Labor: Expectant management.    Delivery plan: Hopeful for vaginal birth.   Room prepared for second stage.   Dr Valentino Saxon notified of admission and plan of care.    Gunnar Bulla, CNM [facility], [facility]

## 2019-12-01 NOTE — OB Triage Note (Signed)
Patient arrived in triage with c/o leaking fluid at approx 0111, small gush with trickle, bigger gush about 20 mins later. Pt reports fluid clear and odorless. Reports good fetal movement. Reports some mild contractions and some constant back pain noted. Denies vaginal bleeding. EFM applied and assessing. Initial FHR 135.

## 2019-12-01 NOTE — Anesthesia Procedure Notes (Addendum)
Epidural Patient location during procedure: OB Start time: 12/01/2019 9:17 AM End time: 12/01/2019 9:23 AM  Staffing Anesthesiologist: Karleen Hampshire, MD Resident/CRNA: Irving Burton, CRNA Performed: resident/CRNA   Preanesthetic Checklist Completed: patient identified, IV checked, site marked, risks and benefits discussed, surgical consent, monitors and equipment checked and pre-op evaluation  Epidural Patient position: sitting Prep: ChloraPrep and site prepped and draped Patient monitoring: heart rate, continuous pulse ox and blood pressure Approach: midline Location: L3-L4 Injection technique: LOR saline  Needle:  Needle type: Tuohy  Needle gauge: 17 G Needle length: 9 cm Needle insertion depth: 8.5 cm Catheter type: closed end flexible Catheter size: 19 Gauge Catheter at skin depth: 13 cm Test dose: negative and 1.5% lidocaine with Epi 1:200 K  Assessment Events: blood not aspirated, injection not painful, no injection resistance, no paresthesia and negative IV test  Additional Notes Reason for block:procedure for pain

## 2019-12-02 ENCOUNTER — Other Ambulatory Visit: Payer: Self-pay

## 2019-12-02 ENCOUNTER — Encounter
Admission: EM | Disposition: A | Discharge status: Home / Self Care | Payer: Self-pay | Source: Home / Self Care | Attending: Certified Nurse Midwife

## 2019-12-02 ENCOUNTER — Inpatient Hospital Stay: Payer: Medicaid Other | Admitting: Anesthesiology

## 2019-12-02 ENCOUNTER — Encounter: Payer: Self-pay | Admitting: Obstetrics and Gynecology

## 2019-12-02 DIAGNOSIS — Z302 Encounter for sterilization: Secondary | ICD-10-CM

## 2019-12-02 HISTORY — PX: TUBAL LIGATION: SHX77

## 2019-12-02 LAB — GLUCOSE, CAPILLARY
Glucose-Capillary: 164 mg/dL — ABNORMAL HIGH (ref 70–99)
Glucose-Capillary: 57 mg/dL — ABNORMAL LOW (ref 70–99)
Glucose-Capillary: 166 mg/dL — ABNORMAL HIGH (ref 70–99)
Glucose-Capillary: 62 mg/dL — ABNORMAL LOW (ref 70–99)
Glucose-Capillary: 72 mg/dL (ref 70–99)
Glucose-Capillary: 115 mg/dL — ABNORMAL HIGH (ref 70–99)
Glucose-Capillary: 173 mg/dL — ABNORMAL HIGH (ref 70–99)
Glucose-Capillary: 250 mg/dL — ABNORMAL HIGH (ref 70–99)
Glucose-Capillary: 273 mg/dL — ABNORMAL HIGH (ref 70–99)
Glucose-Capillary: 206 mg/dL — ABNORMAL HIGH (ref 70–99)

## 2019-12-02 LAB — CBC
HCT: 32.6 % — ABNORMAL LOW (ref 36.0–46.0)
Hemoglobin: 11.3 g/dL — ABNORMAL LOW (ref 12.0–15.0)
MCH: 29.4 pg (ref 26.0–34.0)
MCHC: 34.7 g/dL (ref 30.0–36.0)
MCV: 84.7 fL (ref 80.0–100.0)
Platelets: 271 10*3/uL (ref 150–400)
RBC: 3.85 MIL/uL — ABNORMAL LOW (ref 3.87–5.11)
RDW: 12.8 % (ref 11.5–15.5)
WBC: 13 10*3/uL — ABNORMAL HIGH (ref 4.0–10.5)
nRBC: 0 % (ref 0.0–0.2)

## 2019-12-02 LAB — COMPREHENSIVE METABOLIC PANEL
ALT: 7 U/L (ref 0–44)
AST: 9 U/L — ABNORMAL LOW (ref 15–41)
Albumin: 2.3 g/dL — ABNORMAL LOW (ref 3.5–5.0)
Alkaline Phosphatase: 94 U/L (ref 38–126)
Anion gap: 9 (ref 5–15)
BUN: 13 mg/dL (ref 6–20)
CO2: 23 mmol/L (ref 22–32)
Calcium: 8.4 mg/dL — ABNORMAL LOW (ref 8.9–10.3)
Chloride: 107 mmol/L (ref 98–111)
Creatinine, Ser: 0.63 mg/dL (ref 0.44–1.00)
GFR calc Af Amer: >60 mL/min (ref >60)
GFR calc non Af Amer: >60 mL/min (ref >60)
Glucose, Bld: 57 mg/dL — ABNORMAL LOW (ref 70–99)
Potassium: 3.5 mmol/L (ref 3.5–5.1)
Sodium: 139 mmol/L (ref 135–145)
Total Bilirubin: 0.4 mg/dL (ref 0.3–1.2)
Total Protein: 5.1 g/dL — ABNORMAL LOW (ref 6.5–8.1)

## 2019-12-02 LAB — VITAMIN D 25 HYDROXY (VIT D DEFICIENCY, FRACTURES): Vit D, 25-Hydroxy: 10.7 ng/mL — ABNORMAL LOW (ref 30–100)

## 2019-12-02 SURGERY — LIGATION, FALLOPIAN TUBE, POSTPARTUM
Anesthesia: General | Laterality: Bilateral

## 2019-12-02 MED ORDER — DEXTROSE 50 % IV SOLN
25.0000 g | INTRAVENOUS | Status: AC
  Administered 2019-12-02: 25 g via INTRAVENOUS
  Filled 2019-12-02: qty 50

## 2019-12-02 MED ORDER — LIDOCAINE HCL (PF) 2 % IJ SOLN
INTRAMUSCULAR | Status: AC
  Filled 2019-12-02: qty 10

## 2019-12-02 MED ORDER — SUCCINYLCHOLINE CHLORIDE 20 MG/ML IJ SOLN
INTRAMUSCULAR | Status: DC | PRN
  Administered 2019-12-02: 180 mg via INTRAVENOUS

## 2019-12-02 MED ORDER — FENTANYL CITRATE (PF) 100 MCG/2ML IJ SOLN
INTRAMUSCULAR | Status: AC
  Filled 2019-12-02: qty 2

## 2019-12-02 MED ORDER — LIDOCAINE HCL (CARDIAC) PF 100 MG/5ML IV SOSY
PREFILLED_SYRINGE | INTRAVENOUS | Status: DC | PRN
  Administered 2019-12-02: 80 mg via INTRAVENOUS

## 2019-12-02 MED ORDER — INSULIN ASPART 100 UNIT/ML ~~LOC~~ SOLN: 0.0000 [IU] | Freq: Three times a day (TID) | SUBCUTANEOUS | Status: DC

## 2019-12-02 MED ORDER — DEXAMETHASONE SODIUM PHOSPHATE 10 MG/ML IJ SOLN
INTRAMUSCULAR | Status: DC | PRN
  Administered 2019-12-02: 10 mg via INTRAVENOUS

## 2019-12-02 MED ORDER — MIDAZOLAM HCL 2 MG/2ML IJ SOLN
INTRAMUSCULAR | Status: DC | PRN
  Administered 2019-12-02: 2 mg via INTRAVENOUS

## 2019-12-02 MED ORDER — BUPIVACAINE HCL (PF) 0.5 % IJ SOLN
INTRAMUSCULAR | Status: AC
  Filled 2019-12-02: qty 30

## 2019-12-02 MED ORDER — MIDAZOLAM HCL 2 MG/2ML IJ SOLN
INTRAMUSCULAR | Status: AC
  Filled 2019-12-02: qty 2

## 2019-12-02 MED ORDER — FENTANYL CITRATE (PF) 100 MCG/2ML IJ SOLN
INTRAMUSCULAR | Status: DC | PRN
  Administered 2019-12-02 (×4): 50 ug via INTRAVENOUS

## 2019-12-02 MED ORDER — SUCCINYLCHOLINE CHLORIDE 200 MG/10ML IV SOSY
PREFILLED_SYRINGE | INTRAVENOUS | Status: AC
  Filled 2019-12-02: qty 10

## 2019-12-02 MED ORDER — KETOROLAC TROMETHAMINE 30 MG/ML IJ SOLN
INTRAMUSCULAR | Status: DC | PRN
  Administered 2019-12-02: 30 mg via INTRAVENOUS

## 2019-12-02 MED ORDER — OXYCODONE HCL 5 MG PO TABS
ORAL_TABLET | ORAL | Status: AC
  Administered 2019-12-02: 5 mg via ORAL
  Filled 2019-12-02: qty 1

## 2019-12-02 MED ORDER — DEXAMETHASONE SODIUM PHOSPHATE 10 MG/ML IJ SOLN
INTRAMUSCULAR | Status: AC
  Filled 2019-12-02: qty 1

## 2019-12-02 MED ORDER — FENTANYL CITRATE (PF) 100 MCG/2ML IJ SOLN: 25.0000 ug | INTRAMUSCULAR | Status: DC | PRN

## 2019-12-02 MED ORDER — LACTATED RINGERS IV SOLN: INTRAVENOUS | Status: DC | PRN

## 2019-12-02 MED ORDER — BUPIVACAINE HCL 0.5 % IJ SOLN
INTRAMUSCULAR | Status: DC | PRN
  Administered 2019-12-02: 7 mL

## 2019-12-02 MED ORDER — ONDANSETRON HCL 4 MG/2ML IJ SOLN
INTRAMUSCULAR | Status: AC
  Filled 2019-12-02: qty 2

## 2019-12-02 MED ORDER — SIMETHICONE 80 MG PO CHEW: 80.0000 mg | CHEWABLE_TABLET | Freq: Four times a day (QID) | ORAL | Status: DC | PRN

## 2019-12-02 MED ORDER — OXYCODONE-ACETAMINOPHEN 5-325 MG PO TABS: 1.0000 | ORAL_TABLET | ORAL | Status: DC | PRN

## 2019-12-02 MED ORDER — OXYCODONE HCL 5 MG/5ML PO SOLN: 5.0000 mg | Freq: Once | ORAL | Status: AC | PRN

## 2019-12-02 MED ORDER — DEXTROSE IN LACTATED RINGERS 5 % IV SOLN: INTRAVENOUS | Status: DC

## 2019-12-02 MED ORDER — OXYCODONE HCL 5 MG PO TABS: 5.0000 mg | ORAL_TABLET | Freq: Once | ORAL | Status: AC | PRN

## 2019-12-02 MED ORDER — INSULIN ASPART 100 UNIT/ML ~~LOC~~ SOLN
0.0000 [IU] | Freq: Once | SUBCUTANEOUS | Status: AC
  Administered 2019-12-02: 5 [IU] via SUBCUTANEOUS
  Filled 2019-12-02: qty 1

## 2019-12-02 MED ORDER — KETOROLAC TROMETHAMINE 30 MG/ML IJ SOLN
INTRAMUSCULAR | Status: AC
  Filled 2019-12-02: qty 1

## 2019-12-02 MED ORDER — ONDANSETRON HCL 4 MG/2ML IJ SOLN
INTRAMUSCULAR | Status: DC | PRN
  Administered 2019-12-02: 4 mg via INTRAVENOUS

## 2019-12-02 MED ORDER — PROPOFOL 10 MG/ML IV BOLUS
INTRAVENOUS | Status: DC | PRN
  Administered 2019-12-02: 50 mg via INTRAVENOUS
  Administered 2019-12-02: 150 mg via INTRAVENOUS

## 2019-12-02 SURGICAL SUPPLY — 34 items
BENZOIN TINCTURE PRP APPL 2/3 (GAUZE/BANDAGES/DRESSINGS) ×1 IMPLANT
BLADE SURG 15 STRL LF DISP TIS (BLADE) ×1 IMPLANT
BLADE SURG 15 STRL SS (BLADE) ×2
BNDG ADH 2 X3.75 FABRIC TAN LF (GAUZE/BANDAGES/DRESSINGS) ×1 IMPLANT
CATH ROBINSON RED A/P 16FR (CATHETERS) ×2 IMPLANT
CHLORAPREP W/TINT 26 (MISCELLANEOUS) ×3 IMPLANT
CLIP FILSHIE TUBAL LIGA STRL (Clip) ×3 IMPLANT
CLOSURE WOUND 1/2 X4 (GAUZE/BANDAGES/DRESSINGS)
COVER WAND RF STERILE (DRAPES) IMPLANT
DRAPE LAPAROTOMY 77X122 PED (DRAPES) ×3 IMPLANT
DRSG TEGADERM 2-3/8X2-3/4 SM (GAUZE/BANDAGES/DRESSINGS) ×1 IMPLANT
DRSG TELFA 4X3 1S NADH ST (GAUZE/BANDAGES/DRESSINGS) ×3 IMPLANT
GLOVE BIOGEL PI ORTHO PRO 7.5 (GLOVE) ×2
GLOVE PI ORTHO PRO STRL 7.5 (GLOVE) ×1 IMPLANT
GLOVE SURG SYN 8.0 (GLOVE) ×3 IMPLANT
GLOVE SURG SYN 8.0 PF PI (GLOVE) ×1 IMPLANT
GOWN STRL REUS W/ TWL LRG LVL3 (GOWN DISPOSABLE) ×2 IMPLANT
GOWN STRL REUS W/TWL LRG LVL3 (GOWN DISPOSABLE) ×4
GRADUATE 1200CC STRL 31836 (MISCELLANEOUS) ×2 IMPLANT
KIT TURNOVER CYSTO (KITS) ×1 IMPLANT
LABEL OR SOLS (LABEL) ×3 IMPLANT
NDL HYPO 25GX1X1/2 BEV (NEEDLE) ×1 IMPLANT
NEEDLE HYPO 25GX1X1/2 BEV (NEEDLE) ×3 IMPLANT
NS IRRIG 500ML POUR BTL (IV SOLUTION) ×3 IMPLANT
PACK BASIN MINOR ARMC (MISCELLANEOUS) ×3 IMPLANT
RETRACTOR RING XSMALL (MISCELLANEOUS) ×1 IMPLANT
RTRCTR WOUND ALEXIS 13CM XS SH (MISCELLANEOUS) ×3
STRIP CLOSURE SKIN 1/2X4 (GAUZE/BANDAGES/DRESSINGS) ×1 IMPLANT
SUT VIC AB 4-0 PS2 18 (SUTURE) ×3 IMPLANT
SUT VICRYL 0 AB UR-6 (SUTURE) ×6 IMPLANT
SYR 10ML LL (SYRINGE) ×3 IMPLANT
TAPE SURG TRANSPORE 1 IN (GAUZE/BANDAGES/DRESSINGS) ×1 IMPLANT
TAPE SURGICAL TRANSPORE 1 IN (GAUZE/BANDAGES/DRESSINGS)
TRAY PREP VAG/GEN (MISCELLANEOUS) ×2 IMPLANT

## 2019-12-02 NOTE — Progress Notes (Signed)
I met with her immediately prior to her surgery.    Tubal We have discussed permanent sterilization in detail.  I have reviewed Filshie clip placement as a means of sterilization.  I have explained the risks and benefits of this procedure in detail.  I have stressed the fact that this is a permanent and not reversible procedure and there is a failure rate of 3 to 7 per 1000 which is somewhat timing and method dependent.  I have discussed the possibility of inadvertent damage to bowel, bladder, blood vessels or other internal organs..  I have specifically discussed the risk of anesthesia, infection and blood loss with her.  We have discussed the possibility of laparotomy should there be a problem and the additional possibility of a longer hospital stay.  I have spoken with her regarding the recovery period, informing her that after this procedure, most people are performing their normal daily activities within one week.  I have recommended abstinence or another birth control method for 2-4 weeks following this procedure.  Other options of birth control have also been discussed.  The procedure of sterilization and alternate methods of birth control were discussed in detail with the patient.  I have answered all her questions and I believe that she has an adequate and informed understanding of the procedure.

## 2019-12-02 NOTE — Progress Notes (Signed)
Pt taken to OR by OR staff via bed.

## 2019-12-02 NOTE — Lactation Note (Addendum)
This note was copied from a baby's chart. Small amt (approx. 0.5 cc) expressed colostrum in colostrum bottle, labeled and taken to SCN breastmilk refrig., mom being taken down to OR for BTL.  Mom stated she spoke with [facility], she has an appt on Monday to see [facility] and pick up an electric breast pump.  Lactation will loan her an electric breast pump when she is discharged tomorrow until she can get the [facility] pump on Monday.

## 2019-12-02 NOTE — Lactation Note (Signed)
This note was copied from a baby's chart. Lactation Consultation Note  Patient Name: Janice Kaufman CVELF'Y Date: 12/02/2019 Reason for consult: Follow-up assessment;NICU baby;Late-preterm 34-36.6wks   Maternal Data Formula Feeding for Exclusion: No Does the patient have breastfeeding experience prior to this delivery?: Yes  Feeding Feeding Type: Breast Fed Nipple Type: Slow - flow Breastfeeding had been stopped by the time I saw mom at bedside of baby, just finished with formula feed by bottle, 24 mm nipple shield was introduced by SCN nurse, Amy, baby tired quickly at breast  LATCH Score Latch: Repeated attempts needed to sustain latch, nipple held in mouth throughout feeding, stimulation needed to elicit sucking reflex.  Audible Swallowing: Spontaneous and intermittent  Type of Nipple: Everted at rest and after stimulation  Comfort (Breast/Nipple): Soft / non-tender  Hold (Positioning): Assistance needed to correctly position infant at breast and maintain latch.  LATCH Score: 8  Interventions Interventions: DEBP  Lactation Tools Discussed/Used Tools: Nipple Shields Nipple shield size: 24 WIC Program: Yes Mom encouraged to pump when she goes back to pt room, [facility] referral faxed to assist with obtaining an electric pump for home use when mom is d/c'd tomorrow, I encouraged mom to call WIC as well to arrange pump pick up.   Consult Status Consult Status: Follow-up Date: 12/02/19 Follow-up type: In-patient    Dyann Kief 12/02/2019, 11:32 AM

## 2019-12-02 NOTE — Progress Notes (Signed)
Progress Note - Vaginal Delivery  Janice Kaufman is a 29 y.o. [id number] now PP day 1 s/p Vaginal, Spontaneous .   Subjective:  The patient reports no complaints, up ad lib, voiding, tolerating PO and + flatus. Scheduled for postpartum tubal ligation this afternoon.   Objective:  Vital signs in last 24 hours: Temp:  [97.7 F (36.5 C)-98.6 F (37 C)] 98.3 F (36.8 C) (03/19 0835) Pulse Rate:  [61-121] 80 (03/19 0835) Resp:  [17-20] 18 (03/19 0835) BP: (130-179)/(76-127) 143/94 (03/19 0835) SpO2:  [96 %-100 %] 96 % (03/19 0421)  Physical Exam:  General: alert, cooperative, appears stated age, fatigued and mild distress Lochia: appropriate Uterine Fundus: firm@u  DVT Evaluation: No evidence of DVT seen on physical exam. No cords or calf tenderness. No significant calf/ankle edema.    Data Review Recent Labs    12/01/19 0348 12/02/19 0601  HGB 13.1 11.3*  HCT 37.6 32.6*    Assessment/Plan: Active Problems:   Labor and delivery, indication for care   Plan for discharge tomorrow   -- Continue routine PP care.     Doreene Burke, CNM  12/02/2019 9:03 AM

## 2019-12-02 NOTE — Anesthesia Postprocedure Evaluation (Signed)
Anesthesia Post Note  Patient: Janice Kaufman  Procedure(s) Performed: POST PARTUM TUBAL LIGATION (Bilateral )  Patient location during evaluation: PACU Anesthesia Type: General Level of consciousness: awake and alert Pain management: pain level controlled Vital Signs Assessment: post-procedure vital signs reviewed and stable Respiratory status: spontaneous breathing, nonlabored ventilation, respiratory function stable and patient connected to nasal cannula oxygen Cardiovascular status: blood pressure returned to baseline and stable Postop Assessment: no apparent nausea or vomiting Anesthetic complications: no     Last Vitals:  Vitals:   12/02/19 1730 12/02/19 1742  BP:  (!) 148/98  Pulse:  88  Resp:  16  Temp:  (!) 36.2 C  SpO2: 95% 96%    Last Pain:  Vitals:   12/02/19 1742  TempSrc:   PainSc: 4                  Cleda Mccreedy Ashawnti Tangen

## 2019-12-02 NOTE — Anesthesia Preprocedure Evaluation (Signed)
Anesthesia Evaluation  Patient identified by MRN, date of birth, ID band Patient awake    Reviewed: Allergy & Precautions, H&P , NPO status , Patient's Chart, lab work & pertinent test results  History of Anesthesia Complications Negative for: history of anesthetic complications  Airway Mallampati: II  TM Distance: >3 FB Neck ROM: full    Dental  (+) Chipped   Pulmonary neg shortness of breath, Current Smoker and Patient abstained from smoking.,           Cardiovascular Exercise Tolerance: Good (-) angina(-) Past MI and (-) DOE negative cardio ROS       Neuro/Psych PSYCHIATRIC DISORDERS negative neurological ROS  negative psych ROS   GI/Hepatic negative GI ROS, Neg liver ROS,   Endo/Other  diabetes, Type 2  Renal/GU      Musculoskeletal   Abdominal   Peds  Hematology negative hematology ROS (+)   Anesthesia Other Findings Past Medical History: No date: Abnormal Pap smear of cervix No date: Anxiety No date: Depression No date: Diabetes mellitus ([facility])     Comment:  Type 2 No date: History of abnormal cervical Pap smear No date: History of gestational hypertension No date: History of pre-eclampsia No date: History of urinary tract infection  Past Surgical History: No date: COLPOSCOPY 10/26/2019: HEMORRHOID SURGERY; N/A     Comment:  Procedure: HEMORRHOIDECTOMY;  Surgeon: Earline Mayotte, MD;  Location: [facility] ORS;  Service: General;                Laterality: N/A;  BMI    Body Mass Index: 31.64 kg/m      Reproductive/Obstetrics negative OB ROS                             Anesthesia Physical Anesthesia Plan  ASA: II  Anesthesia Plan: General ETT   Post-op Pain Management:    Induction: Intravenous  PONV Risk Score and Plan: Ondansetron, Dexamethasone, Midazolam and Treatment may vary due to age or medical condition  Airway Management Planned: Oral  ETT  Additional Equipment:   Intra-op Plan:   Post-operative Plan: Extubation in OR  Informed Consent: I have reviewed the patients History and Physical, chart, labs and discussed the procedure including the risks, benefits and alternatives for the proposed anesthesia with the patient or authorized representative who has indicated his/her understanding and acceptance.     Dental Advisory Given  Plan Discussed with: Anesthesiologist, CRNA and Surgeon  Anesthesia Plan Comments: (Patient declines spinal  Patient consented for risks of anesthesia including but not limited to:  - adverse reactions to medications - damage to teeth, lips or other oral mucosa - sore throat or hoarseness - Damage to heart, brain, lungs or loss of life  Patient voiced understanding.)        Anesthesia Quick Evaluation

## 2019-12-02 NOTE — Progress Notes (Signed)
PACU RN, Gavin Pound called to give report. Report given, no further questions at this time.

## 2019-12-02 NOTE — Progress Notes (Signed)
CBG 62. Spoke with Dr Randa Ngo, give orange juice. Patient provided orange juice and graham crackers. Per patient her lowest is 97.

## 2019-12-02 NOTE — Anesthesia Postprocedure Evaluation (Signed)
Anesthesia Post Note  Patient: Janice Kaufman  Procedure(s) Performed: AN AD HOC LABOR EPIDURAL  Patient location during evaluation: Mother Baby Anesthesia Type: Epidural Level of consciousness: awake and alert Pain management: pain level controlled Vital Signs Assessment: post-procedure vital signs reviewed and stable Respiratory status: spontaneous breathing, nonlabored ventilation and respiratory function stable Cardiovascular status: stable Postop Assessment: no headache, no backache and epidural receding Anesthetic complications: no     Last Vitals:  Vitals:   12/02/19 0835 12/02/19 0930  BP: (!) 143/94   Pulse: 80   Resp: 18   Temp: 36.8 C   SpO2:  97%    Last Pain:  Vitals:   12/02/19 0840  TempSrc:   PainSc: 0-No pain                 Seri Kimmer B Alonza Smoker

## 2019-12-02 NOTE — Interval H&P Note (Signed)
History and Physical Interval Note:  12/02/2019 3:14 PM  Janice Kaufman  has presented today for surgery, with the diagnosis of n/a.  The various methods of treatment have been discussed with the patient and family. After consideration of risks, benefits and other options for treatment, the patient has consented to  Procedure(s): POST PARTUM TUBAL LIGATION (Bilateral) as a surgical intervention.  The patient's history has been reviewed, patient examined, no change in status, stable for surgery.  I have reviewed the patient's chart and labs.  Questions were answered to the patient's satisfaction.     Brennan Bailey

## 2019-12-02 NOTE — Clinical Social Work Maternal (Addendum)
CLINICAL SOCIAL WORK MATERNAL/CHILD NOTE  Patient Details  Name: Janice Kaufman MRN: [id number] Date of Birth: May 10, 1991  Date:  12/02/2019  Clinical Social Worker Initiating Note:  Oleh Genin, LCSW Date/Time: Initiated:  12/02/19/      Child's Name:  Lynnda Child   Biological Parents:  Father, Mother(FOB not at bedside, at home with other children per MOB)   Need for Interpreter:  None   Reason for Referral:  Late or No Prenatal Care Flavia Shipper Score: 11)   Address:  [place] [address]    Phone number:  [phone] (home)     Additional phone number: None  Household Members/Support Persons (HM/SP):   Household Member/Support Person 1, Household Member/Support Person 2, Household Member/Support Person 3, Household Member/Support Person 4   HM/SP Name Relationship DOB or Age  HM/SP -1 Suzy Bouchard boyfriend/FOB unknown  HM/SP -2 Skylar Koeller child 11  HM/SP -Gem child 7  HM/SP -Gap child 2  HM/SP -5        HM/SP -6        HM/SP -7        HM/SP -8          Natural Supports (not living in the home):  Extended Family, Immediate Family   Professional Supports:     Employment: Unemployed   Type of Work:     Education:  Programmer, systems   Homebound arranged:    Museum/gallery curator Resources:  Medicaid   Other Resources:  Physicist, medical , [place] Considerations Which May Impact Care:  None reported  Strengths:  Ability to meet basic needs , Compliance with medical plan , Home prepared for child , Pediatrician chosen   Psychotropic Medications:         Pediatrician:    [employer]  Pediatrician List:   [place], [place])  [facility]      Pediatrician Fax Number:    Risk Factors/Current Problems:  Mental Health Concerns (Reports she utilizes  coping skills which she feels are effective)   Cognitive State:  Able to Concentrate , Alert    Mood/Affect:  Calm    CSW Assessment: CSW was consulted due to [place] Score of 11 and Late PNC. Patient started [facility] at 26 weeks and had 3+ visits, therefore this would not be classified as late [facility]. CSW will not follow drug screen results due to this. CSW did inform MOB of drug screen being done and monitored by other staff.   CSW met with MOB at bedside. Explained CSW role. Patient reported she is feeling "overwhelmed." MOB attributed this to breast feeding/pumping and the fact that her boyfriend/FOB was not able to be at the hospital with her for delivery. MOB reported FOB is at home with her other children as their childcare plans fell through. CSW provided emotional support and reassurance.   Confirmed contact information for MOB and obtained household member information (see above). MOB reported her parents and FOB's parents are very supportive. She stated they live out of town, but are supportive via phone. MOB reported she plans to move closer to family in the future.   MOB reported she receives [facility] and Physicist, medical and will inform Workers of 55 birth.   MOB reported she has a history of depression and anxiety. She stated  she has tried mental health medications in the past, but did not like the side effects or how they made her feel. MOB has also seen a counselor in the past, is not currently in therapy. MOB reported she feels she has effetive coping skills and her mental health is managed at this time. She is aware of additional resources if needed.   CSW provided information sheets and education on PPD and SIDS. MOB verbalized understanding.   MOB reported she has all needed items for Baby. She stated the car seat is used, but does not expire for 3 years. Baby will be sleeping in a bassinet.   MOB drives to appointments.   MOB denied additional needs or questions at this time.  Informed MOB that CSW will touch base next week if Baby is still in the NICU. CSW encouraged MOB to reach out if any needs arise. She agreed.  CSW Plan/Description:  No Further Intervention Required/No Barriers to Discharge    [place], LCSW 12/02/2019, 12:44 PM

## 2019-12-02 NOTE — Anesthesia Procedure Notes (Signed)
Procedure Name: Intubation Date/Time: 12/02/2019 4:08 PM Performed by: Elmarie Mainland, CRNA Pre-anesthesia Checklist: Patient identified, Emergency Drugs available, Suction available and Patient being monitored Patient Re-evaluated:Patient Re-evaluated prior to induction Oxygen Delivery Method: Circle system utilized Preoxygenation: Pre-oxygenation with 100% oxygen Induction Type: IV induction and Rapid sequence Laryngoscope Size: McGraph and 3 Grade View: Grade I Tube type: Oral Tube size: 7.0 mm Airway Equipment and Method: Video-laryngoscopy and Stylet Placement Confirmation: ETT inserted through vocal cords under direct vision,  positive ETCO2 and breath sounds checked- equal and bilateral Secured at: 21 cm Tube secured with: Tape Dental Injury: Teeth and Oropharynx as per pre-operative assessment

## 2019-12-02 NOTE — Progress Notes (Signed)
Inpatient Diabetes Program Recommendations  AACE/ADA: New Consensus Statement on Inpatient Glycemic Control   Target Ranges:  Prepandial:   less than 140 mg/dL      Peak postprandial:   less than 180 mg/dL (1-2 hours)      Critically ill patients:  140 - 180 mg/dL  Results for Janice Kaufman, Janice Kaufman (MRN [id number]) as of 12/02/2019 09:04  Ref. Range 12/02/2019 06:01  Glucose Latest Ref Range: 70 - 99 mg/dL 57 (L)   Results for Janice Kaufman, Janice Kaufman (MRN [id number]) as of 12/02/2019 09:04  Ref. Range 12/01/2019 08:17 12/01/2019 11:45 12/01/2019 20:06 12/01/2019 22:10  Glucose-Capillary Latest Ref Range: 70 - 99 mg/dL 481 (H) 856 (H) 85 314 (H)   Review of Glycemic Control  Diabetes history: DM2 Outpatient Diabetes medications: Glyburide 7.5 mg QAM, Glyburide 5 mg QPM Current orders for Inpatient glycemic control: Glyburide 7.5 mg QAM, Glyburide 5 mg QPM  Inpatient Diabetes Program Recommendations:   CBG monitoring and Insulin: Please consider ordering CBGs AC&HS and Novolog 0-9 units TID with meals and Novolog 0-5 units QHS.  Oral Agents: Please discontinue Glyburide. If glucose is consistently elevated, may want to consider ordering Metformin XR 500 mg BID and discharging on the same then have patient follow up with PCP regarding DM management.  NOTE: Per chart review, noted patient has DM2 hx and was taking Metformin XR 1000 mg BID prior to pregnancy. At time of admission, patient was taking Glyburide BID for glycemic control. Per chart, patient delivered on 12/01/19. Lab glucose 57 mg/dl today. Would recommend discontinuing Glyburide and order CBGs AC&HS with Novolog 0-9 units TID with meals and Novolog 0-5 units QHS. If glucose becomes consistently elevated, may want to order Metformin XR 500 mg BID and discharge patient on same then ask that patient follow up with PCP regarding DM management.  Thanks, Orlando Penner, RN, MSN, CDE Diabetes Coordinator Inpatient Diabetes Program [phone] (Team Pager  from 8am to 5pm)

## 2019-12-02 NOTE — Transfer of Care (Signed)
Immediate Anesthesia Transfer of Care Note  Patient: Janice Kaufman  Procedure(s) Performed: POST PARTUM TUBAL LIGATION (Bilateral )  Patient Location: PACU  Anesthesia Type:General  Level of Consciousness: awake, alert  and oriented  Airway & Oxygen Therapy: Patient Spontanous Breathing and Patient connected to face mask oxygen  Post-op Assessment: Report given to RN and Post -op Vital signs reviewed and stable  Post vital signs: Reviewed and stable  Last Vitals:  Vitals Value Taken Time  BP 141/98 12/02/19 1727  Temp 36.2 C 12/02/19 1709  Pulse 79 12/02/19 1731  Resp 22 12/02/19 1731  SpO2 95 % 12/02/19 1731  Vitals shown include unvalidated device data.  Last Pain:  Vitals:   12/02/19 1709  TempSrc:   PainSc: Asleep         Complications: No apparent anesthesia complications

## 2019-12-02 NOTE — Op Note (Signed)
      OPERATIVE NOTE 12/02/2019 5:25 PM  PRE-OPERATIVE DIAGNOSIS:  1) Desires permanent sterilization  POST-OPERATIVE DIAGNOSIS:  Same  OPERATION:  Post-partum Filshie clip tubal occlusion for sterilization  SURGEON(S): Surgeon(s) and Role:    Linzie Collin, MD - Primary   ANESTHESIA: General  ESTIMATED BLOOD LOSS: 10mL  OPERATIVE FINDINGS:   COMPLICATIONS: None  DISPOSITION: Stable to recovery room  DESCRIPTION OF PROCEDURE:     The patient was prepped and draped in the supine position and placed under general anesthesia. The bladder was emptied.  A small infraumbilical incision was made and the fascia was identified and incised using Mayo scissors. The peritoneum was carefully identified and entered with care being taken to avoid bowel. A self retaining ring retractor was placed. The right fallopian tube was identified and followed out to its fine fimbriated end and then back approximate half the way. At this point a section of tube was elevated on a Babcock clamp and a Filshie clip was used to completely occlude the tube in a perpendicular manner. The left fallopian tube was similarly identified and again the midportion of the tube was completely occluded using a Filshie clip in a perpendicular manner. Hemostasis of the fallopian tubes as well as the pelvis was noted.  A deep suture through the fascia of 0 Vicryl followed by subcuticular closure of the skin was performed. A long-acting anesthetic was injected.  A dressing was applied. The patient went to the recovery room in stable condition.   Elonda Husky, M.D. 12/02/2019 5:25 PM

## 2019-12-03 ENCOUNTER — Ambulatory Visit: Payer: Self-pay

## 2019-12-03 LAB — GLUCOSE, CAPILLARY
Glucose-Capillary: 44 mg/dL — CL (ref 70–99)
Glucose-Capillary: 94 mg/dL (ref 70–99)
Glucose-Capillary: 108 mg/dL — ABNORMAL HIGH (ref 70–99)

## 2019-12-03 MED ORDER — NIFEDIPINE ER 30 MG PO TB24: 30.0000 mg | ORAL_TABLET | Freq: Two times a day (BID) | ORAL | 5 refills | Status: DC

## 2019-12-03 MED ORDER — GLYBURIDE 2.5 MG PO TABS: 7.5000 mg | ORAL_TABLET | Freq: Every day | ORAL | 5 refills | Status: DC

## 2019-12-03 MED ORDER — OXYCODONE-ACETAMINOPHEN 5-325 MG PO TABS: 1.0000 | ORAL_TABLET | ORAL | 0 refills | Status: DC | PRN

## 2019-12-03 MED ORDER — IBUPROFEN 600 MG PO TABS: 600.0000 mg | ORAL_TABLET | Freq: Four times a day (QID) | ORAL | 0 refills | Status: DC

## 2019-12-03 NOTE — Discharge Summary (Signed)
Patient Name: Janice Kaufman DOB: 09-06-1991 MRN: [id number]                            Discharge Summary  Date of Admission: 12/01/2019 Date of Discharge: 12/03/2019 Delivering Provider: Diona Fanti   Admitting Diagnosis: Labor and delivery, indication for care [O75.9] at [redacted]w[redacted]d Secondary diagnosis:  Active Problems:   Labor and delivery, indication for care   Mode of Delivery:  spontaneous vaginal delivery and tubal ligation was performed              Discharge diagnosis: Preeclampsia (mild) and Preterm spontaneours vaginal delivery, Type 2 diabetes     Intrapartum Procedures: epidural   Post partum procedures: postpartum tubal ligation  Complications: none                     Discharge Day SOAP Note:  Progress Note - Vaginal Delivery  Janice Kaufman is a 29 y.o. [id number] now PP day 2 s/p Vaginal, Spontaneous . Delivery was complicated by pre-eclampsia, preterm labor  and type 2 diabetes  Subjective  The patient has the following complaints: has no unusual complaints  Pain is controlled with current medications.   Patient is urinating without difficulty.  She is ambulating well.     Objective  Vital signs: BP (!) 142/90 (BP Location: Right Arm)   Pulse 63   Temp 98.1 F (36.7 C) (Oral)   Resp 18   Ht 5\' 6"  (1.676 m)   Wt 88.9 kg   LMP 03/18/2019 (Exact Date) Comment: irregular menses  SpO2 99%   Breastfeeding Unknown   BMI 31.64 kg/m   Physical Exam: Gen: NAD Fundus Fundal Tone: Firm  Lochia Amount: Small        Data Review Labs: Lab Results  Component Value Date   WBC 13.0 (H) 12/02/2019   HGB 11.3 (L) 12/02/2019   HCT 32.6 (L) 12/02/2019   MCV 84.7 12/02/2019   PLT 271 12/02/2019   CBC Latest Ref Rng & Units 12/02/2019 12/01/2019 11/23/2019  WBC 4.0 - 10.5 K/uL 13.0(H) 17.2(H) 14.2(H)  Hemoglobin 12.0 - 15.0 g/dL 11.3(L) 13.1 13.6  Hematocrit 36.0 - 46.0 % 32.6(L) 37.6 39.4  Platelets 150 - 400 K/uL 271 272 235   O  POS  Edinburgh Score: Edinburgh Postnatal Depression Scale Screening Tool 12/03/2019  I have been able to laugh and see the funny side of things. 0  I have looked forward with enjoyment to things. 1  I have blamed myself unnecessarily when things went wrong. 2  I have been anxious or worried for no good reason. 2  I have felt scared or panicky for no good reason. 2  Things have been getting on top of me. 1  I have been so unhappy that I have had difficulty sleeping. 1  I have felt sad or miserable. 1  I have been so unhappy that I have been crying. 1  The thought of harming myself has occurred to me. 0  Edinburgh Postnatal Depression Scale Total 11    Assessment/Plan  Active Problems:   Labor and delivery, indication for care    Plan for discharge today.  Discharge Instructions: Per After Visit Summary. Activity: Advance as tolerated. Pelvic rest for 6 weeks.  Also refer to After Visit Summary Diet: Regular Medications: Allergies as of 12/03/2019   No Known Allergies     Medication List    STOP  taking these medications   aspirin EC 81 MG tablet   NIFEdipine 10 MG capsule Commonly known as: PROCARDIA Replaced by: NIFEdipine 30 MG 24 hr tablet     TAKE these medications   Accu-Chek FastClix Lancets Misc 1 Units by Percutaneous route 4 (four) times daily. Test blood sugar 4 times daily. Before breakfast and 2 hours after every meal.   acetaminophen 325 MG tablet Commonly known as: TYLENOL Take 2 tablets (650 mg total) by mouth every 4 (four) hours as needed (for pain scale < 4  OR  temperature  >/=  100.5 F).   docusate sodium 100 MG capsule Commonly known as: COLACE Take 100 mg by mouth 2 (two) times daily as needed for mild constipation.   glucose blood test strip Test blood sugar 4 times daily. Before breakfast,2 hours after every meal.   glyBURIDE 5 MG tablet Commonly known as: DIABETA Take 1 tablet (5 mg total) by mouth every evening. What changed: Another  medication with the same name was changed. Make sure you understand how and when to take each.   glyBURIDE 2.5 MG tablet Commonly known as: DIABETA Take 3 tablets (7.5 mg total) by mouth daily with breakfast. Start taking on: December 04, 2019 What changed: medication strength   ibuprofen 600 MG tablet Commonly known as: ADVIL Take 1 tablet (600 mg total) by mouth every 6 (six) hours.   multivitamin-prenatal 27-0.8 MG Tabs tablet Take 1 tablet by mouth daily.   NIFEdipine 30 MG 24 hr tablet Commonly known as: ADALAT CC Take 1 tablet (30 mg total) by mouth 2 (two) times daily. Replaces: NIFEdipine 10 MG capsule   oxyCODONE-acetaminophen 5-325 MG tablet Commonly known as: PERCOCET/ROXICET Take 1-2 tablets by mouth every 4 (four) hours as needed for moderate pain.   psyllium 58.6 % packet Commonly known as: METAMUCIL Take 1 packet by mouth daily.      Outpatient follow up: one week blood pressure check, 2 wks tele visit , 6 week postpartum check with Marcelino Duster Postpartum contraception: postpartum tubal done  Discharged Condition: good  Discharged to: home  Newborn Data: Disposition:NICU  Apgars: APGAR (1 MIN): 8   APGAR (5 MINS): 9   APGAR (10 MINS):    Baby Feeding: Bottle and Breast    Doreene Burke, CNM  12/03/2019 10:34 AM

## 2019-12-03 NOTE — Progress Notes (Signed)
Pt discharged home, infant in SCN.  Discharge instructions, prescriptions and follow up appointment given to and reviewed with pt. Pt verbalized understanding. Escorted out by staff.  

## 2019-12-03 NOTE — Discharge Instructions (Signed)
Breast Pumping Tips °Breast pumping is a way to get milk out of your breasts. You will then store the milk for your baby to use when you are away from home. There are three ways to pump. You can: °· Use your hand to massage and squeeze your breast (hand expression). °· Use a hand-held machine to manually pump your milk. °· Use an electric machine to pump your milk. °In the beginning you may not get much milk. After a few days your breasts should make more. Pumping can help you start making milk after your baby is born. Pumping helps you to keep making milk when you are away from your baby. °When should I pump? °You can start pumping soon after your baby is born. Follow these tips: °· When you are with your baby: °? Pump after you breastfeed. °? Pump from the free breast while you breastfeed. °· When you are away from your baby: °? Pump every 2-3 hours for 15 minutes. °? Pump both breasts at the same time if you can. °· If your baby drinks formula, pump around the time your baby gets the formula. °· If you drank alcohol, wait 2 hours before you pump. °· If you are going to have surgery, ask your doctor when you should pump again. °How do I get ready to pump? °Take steps to relax. Try these things to help your milk come in: °· Smell your baby's blanket or clothes. °· Look at a picture or video of your baby. °· Sit in a quiet, private space. °· Massage your breast and nipple. °· Place a cloth on your breast. The cloth should be warm and a little wet. °· Play relaxing music. °· Picture your milk flowing. °What are some tips? °General tips for pumping breast milk ° °· Always wash your hands before pumping. °· If you do not get much milk or if pumping hurts, try different pump settings or a different kind of pump. °· Drink enough fluid so your pee (urine) is clear or pale yellow. °· Wear clothing that opens in the front or is easy to take off. °· Pump milk into a clean bottle or container. °· Do not use anything that has  nicotine or tobacco. Examples are cigarettes and e-cigarettes. If you need help quitting, ask your doctor. °Tips for storing breast milk ° °· Store breast milk in a clean, BPA-free container. These include: °? A glass or plastic bottle. °? A milk storage bag. °· Store only 2-4 ounces of breast milk in each container. °· Swirl the breast milk in the container. Do not shake it. °· Write down the date you pumped the milk on the container. °· This is how long you can store breast milk: °? Room temperature: 6-8 hours. It is best to use the milk within 4 hours. °? Cooler with ice packs: 24 hours. °? Refrigerator: 5-8 days, if the milk is clean. It is best to use the milk within 3 days. °? Freezer: 9-12 months, if the milk is clean and stored away from the freezer door. It is best to use the milk within 6 months. °· Put milk in the back of the refrigerator or freezer. °· Thaw frozen milk using warm water. Do not use the microwave. °Tips for choosing a breast pump °When choosing a pump, keep the following things in mind: °· Manual breast pumps do not need electricity. They cost less. They can be hard to use. °· Electric breast pumps use electricity. They   are more expensive. They are easier to use. They collect more milk.  The suction cup (flange) should be the right size.  Before you buy the pump, check if your insurance will pay for it. Tips for caring for a breast pump  Check the manual that came with your pump for cleaning tips.  Clean the pump after you use it. To do this: ? Wipe down the electrical part. Use a dry cloth or paper towel. Do not put this part in water or in cleaning products. ? Wash the plastic parts with soap and warm water. Or use the dishwasher if the manual says it is safe. You do not need to clean the tubing unless it touched breast milk. ? Let all the parts air dry. Avoid drying them with a cloth or towel. ? When the parts are clean and dry, put the pump back together. Then store the  pump.  If there is water in the tubing when you want to pump: 1. Attach the tubing to the pump. 2. Turn on the pump. 3. Turn off the pump when the tube is dry.  Try not to touch the inside of pump parts. Summary  Pumping can help you start making milk after your baby is born. It lets you keep making milk when you are away from your baby.  When you are away from your baby, pump for about 15 minutes every 2-3 hours. Pump both breasts at the same time, if you can. This information is not intended to replace advice given to you by your health care provider. Make sure you discuss any questions you have with your health care provider. Document Revised: 12/22/2018 Document Reviewed: 10/06/2016 Elsevier Patient Education  2020 [employer]. Postpartum Baby Blues The postpartum period begins right after the birth of a baby. During this time, there is often a lot of joy and excitement. It is also a time of many changes in the life of the parents. No matter how many times a mother gives birth, each child brings new challenges to the family, including different ways of relating to one another. It is common to have feelings of excitement along with confusing changes in moods, emotions, and thoughts. You may feel happy one minute and sad or stressed the next. These feelings of sadness usually happen in the period right after you have your baby, and they go away within a week or two. This is called the "baby blues." What are the causes? There is no known cause of baby blues. It is likely caused by a combination of factors. However, changes in hormone levels after childbirth are believed to trigger some of the symptoms. Other factors that can play a role in these mood changes include:  Lack of sleep.  Stressful life events, such as poverty, caring for a loved one, or death of a loved one.  Genetics. What are the signs or symptoms? Symptoms of this condition include:  Brief changes in mood, such as  going from extreme happiness to sadness.  Decreased concentration.  Difficulty sleeping.  Crying spells and tearfulness.  Loss of appetite.  Irritability.  Anxiety. If the symptoms of baby blues last for more than 2 weeks or become more severe, you may have postpartum depression. How is this diagnosed? This condition is diagnosed based on an evaluation of your symptoms. There are no medical or lab tests that lead to a diagnosis, but there are various questionnaires that a health care provider may use to identify women  with the baby blues or postpartum depression. How is this treated? Treatment is not needed for this condition. The baby blues usually go away on their own in 1-2 weeks. Social support is often all that is needed. You will be encouraged to get adequate sleep and rest. Follow these instructions at home: Lifestyle      Get as much rest as you can. Take a nap when the baby sleeps.  Exercise regularly as told by your health care provider. Some women find yoga and walking to be helpful.  Eat a balanced and nourishing diet. This includes plenty of fruits and vegetables, whole grains, and lean proteins.  Do little things that you enjoy. Have a cup of tea, take a bubble bath, read your favorite magazine, or listen to your favorite music.  Avoid alcohol.  Ask for help with household chores, cooking, grocery shopping, or running errands. Do not try to do everything yourself. Consider hiring a postpartum doula to help. This is a professional who specializes in providing support to new mothers.  Try not to make any major life changes during pregnancy or right after giving birth. This can add stress. General instructions  Talk to people close to you about how you are feeling. Get support from your partner, family members, friends, or other new moms. You may want to join a support group.  Find ways to cope with stress. This may include: ? Writing your thoughts and feelings in  a journal. ? Spending time outside. ? Spending time with people who make you laugh.  Try to stay positive in how you think. Think about the things you are grateful for.  Take over-the-counter and prescription medicines only as told by your health care provider.  Let your health care provider know if you have any concerns.  Keep all postpartum visits as told by your health care provider. This is important. Contact a health care provider if:  Your baby blues do not go away after 2 weeks. Get help right away if:  You have thoughts of taking your own life (suicidal thoughts).  You think you may harm the baby or other people.  You see or hear things that are not there (hallucinations). Summary  After giving birth, you may feel happy one minute and sad or stressed the next. Feelings of sadness that happen right after the baby is born and go away after a week or two are called the "baby blues."  You can manage the baby blues by getting enough rest, eating a healthy diet, exercising, spending time with supportive people, and finding ways to cope with stress.  If feelings of sadness and stress last longer than 2 weeks or get in the way of caring for your baby, talk to your health care provider. This may mean you have postpartum depression. This information is not intended to replace advice given to you by your health care provider. Make sure you discuss any questions you have with your health care provider. Document Revised: 12/24/2018 Document Reviewed: 10/28/2016 Elsevier Patient Education  2020 [employer] Postpartum Care After Vaginal Delivery This sheet gives you information about how to care for yourself from the time you deliver your baby to up to 6-12 weeks after delivery (postpartum period). Your health care provider may also give you more specific instructions. If you have problems or questions, contact your health care provider. Follow these instructions at home: Vaginal  bleeding  It is normal to have vaginal bleeding (lochia) after delivery. Wear a  sanitary pad for vaginal bleeding and discharge. ? During the first week after delivery, the amount and appearance of lochia is often similar to a menstrual period. ? Over the next few weeks, it will gradually decrease to a dry, yellow-brown discharge. ? For most women, lochia stops completely by 4-6 weeks after delivery. Vaginal bleeding can vary from woman to woman.  Change your sanitary pads frequently. Watch for any changes in your flow, such as: ? A sudden increase in volume. ? A change in color. ? Large blood clots.  If you pass a blood clot from your vagina, save it and call your health care provider to discuss. Do not flush blood clots down the toilet before talking with your health care provider.  Do not use tampons or douches until your health care provider says this is safe.  If you are not breastfeeding, your period should return 6-8 weeks after delivery. If you are feeding your child breast milk only (exclusive breastfeeding), your period may not return until you stop breastfeeding. Perineal care  Keep the area between the vagina and the anus (perineum) clean and dry as told by your health care provider. Use medicated pads and pain-relieving sprays and creams as directed.  If you had a cut in the perineum (episiotomy) or a tear in the vagina, check the area for signs of infection until you are healed. Check for: ? More redness, swelling, or pain. ? Fluid or blood coming from the cut or tear. ? Warmth. ? Pus or a bad smell.  You may be given a squirt bottle to use instead of wiping to clean the perineum area after you go to the bathroom. As you start healing, you may use the squirt bottle before wiping yourself. Make sure to wipe gently.  To relieve pain caused by an episiotomy, a tear in the vagina, or swollen veins in the anus (hemorrhoids), try taking a warm sitz bath 2-3 times a day. A sitz  bath is a warm water bath that is taken while you are sitting down. The water should only come up to your hips and should cover your buttocks. Breast care  Within the first few days after delivery, your breasts may feel heavy, full, and uncomfortable (breast engorgement). Milk may also leak from your breasts. Your health care provider can suggest ways to help relieve the discomfort. Breast engorgement should go away within a few days.  If you are breastfeeding: ? Wear a bra that supports your breasts and fits you well. ? Keep your nipples clean and dry. Apply creams and ointments as told by your health care provider. ? You may need to use breast pads to absorb milk that leaks from your breasts. ? You may have uterine contractions every time you breastfeed for up to several weeks after delivery. Uterine contractions help your uterus return to its normal size. ? If you have any problems with breastfeeding, work with your health care provider or Advertising copywriterlactation consultant.  If you are not breastfeeding: ? Avoid touching your breasts a lot. Doing this can make your breasts produce more milk. ? Wear a good-fitting bra and use cold packs to help with swelling. ? Do not squeeze out (express) milk. This causes you to make more milk. Intimacy and sexuality  Ask your health care provider when you can engage in sexual activity. This may depend on: ? Your risk of infection. ? How fast you are healing. ? Your comfort and desire to engage in sexual activity.  You are able to get pregnant after delivery, even if you have not had your period. If desired, talk with your health care provider about methods of birth control (contraception). Medicines  Take over-the-counter and prescription medicines only as told by your health care provider.  If you were prescribed an antibiotic medicine, take it as told by your health care provider. Do not stop taking the antibiotic even if you start to feel  better. Activity  Gradually return to your normal activities as told by your health care provider. Ask your health care provider what activities are safe for you.  Rest as much as possible. Try to rest or take a nap while your baby is sleeping. Eating and drinking   Drink enough fluid to keep your urine pale yellow.  Eat high-fiber foods every day. These may help prevent or relieve constipation. High-fiber foods include: ? Whole grain cereals and breads. ? Brown rice. ? Beans. ? Fresh fruits and vegetables.  Do not try to lose weight quickly by cutting back on calories.  Take your prenatal vitamins until your postpartum checkup or until your health care provider tells you it is okay to stop. Lifestyle  Do not use any products that contain nicotine or tobacco, such as cigarettes and e-cigarettes. If you need help quitting, ask your health care provider.  Do not drink alcohol, especially if you are breastfeeding. General instructions  Keep all follow-up visits for you and your baby as told by your health care provider. Most women visit their health care provider for a postpartum checkup within the first 3-6 weeks after delivery. Contact a health care provider if:  You feel unable to cope with the changes that your child brings to your life, and these feelings do not go away.  You feel unusually sad or worried.  Your breasts become red, painful, or hard.  You have a fever.  You have trouble holding urine or keeping urine from leaking.  You have little or no interest in activities you used to enjoy.  You have not breastfed at all and you have not had a menstrual period for 12 weeks after delivery.  You have stopped breastfeeding and you have not had a menstrual period for 12 weeks after you stopped breastfeeding.  You have questions about caring for yourself or your baby.  You pass a blood clot from your vagina. Get help right away if:  You have chest pain.  You have  difficulty breathing.  You have sudden, severe leg pain.  You have severe pain or cramping in your lower abdomen.  You bleed from your vagina so much that you fill more than one sanitary pad in one hour. Bleeding should not be heavier than your heaviest period.  You develop a severe headache.  You faint.  You have blurred vision or spots in your vision.  You have bad-smelling vaginal discharge.  You have thoughts about hurting yourself or your baby. If you ever feel like you may hurt yourself or others, or have thoughts about taking your own life, get help right away. You can go to the nearest emergency department or call:  Your local emergency services (911 in the [place]  A suicide crisis helpline, such as the [place] at 612-784-6662. This is open 24 hours a day. Summary  The period of time right after you deliver your newborn up to 6-12 weeks after delivery is called the postpartum period.  Gradually return to your normal activities as told  by your health care provider.  Keep all follow-up visits for you and your baby as told by your health care provider. This information is not intended to replace advice given to you by your health care provider. Make sure you discuss any questions you have with your health care provider. Document Revised: 09/04/2017 Document Reviewed: 06/15/2017 Elsevier Patient Education  2020 [employer].

## 2019-12-03 NOTE — Lactation Note (Addendum)
This note was copied from a baby's chart. Lactation Consultation Note  Patient Name: Janice Kaufman HTXHF'S Date: 12/03/2019 Reason for consult: Follow-up assessment;NICU baby;Late-preterm 34-36.6wks;Other (Comment)(Loaned mom Symphony thru [facility] until get [facility] pump)  Mom has been pumping for 60.44 week old in SCN with hypoglycemia.  Mom is GDM.  Mom reports she has been trying to pump around every 3 hours and still only getting a few drops.  She has put her to the breast using #24 nipple shield.  Encouraged mom that this was still normal d/t the viscosity of colostrum. Mom is to be discharged home today and baby is to remain in SCN.  Mom has attempted breast feeding with other children.  With one, mom reports oversupply.  [place] referral has been faxed.  Mom talked to [facility] and set up appointment to go get [facility] loaner pump 12/05/2019.  Rented mom a Symphony pump through [place] until can get WIC pump on Monday.  Coconut oil given and instructed in use.  DEBP kit placed at baby'y bedside in SCN for mom to use when visiting.  Hand out given and reinforced teaching of warmth, massage, hand expression, pumping, collection, storage, cleaning, labeling, transporting and handling of expressed milk.  Lactation Geophysicist/field seismologist given and reviewed encouraging mom to call with any questions, concerns or assistance. Maternal Data Formula Feeding for Exclusion: No Has patient been taught Hand Expression?: Yes Does the patient have breastfeeding experience prior to this delivery?: Yes  Feeding Feeding Type: Bottle Fed - Formula Nipple Type: Regular  LATCH Score                   Interventions Interventions: Hand express;Support pillows;Expressed milk;Coconut oil;DEBP  Lactation Tools Discussed/Used Tools: Pump;Coconut oil;Bottle Breast pump type: Double-Electric Breast Pump WIC Program: Yes Pump Review: Setup, frequency, and cleaning;Milk Storage;Other  (comment) Initiated by:: S.Liane Comber, MPH, [place] Date initiated:: 12/01/19   Consult Status Consult Status: PRN Follow-up type: Call as needed    Jarold Motto 12/03/2019, 2:01 PM

## 2019-12-03 NOTE — Final Progress Note (Signed)
Discharge Day SOAP Note:  Progress Note - Vaginal Delivery  Janice Kaufman is a 29 y.o. [id number] now PP day 2 s/p Vaginal, Spontaneous . Delivery was complicated by pre-eclampsia, preterm labor  and type 2 diabetes  Subjective  The patient has the following complaints: has no unusual complaints  Pain is controlled with current medications.   Patient is urinating without difficulty.  She is ambulating well.     Objective  Vital signs: BP (!) 142/90 (BP Location: Right Arm)   Pulse 63   Temp 98.1 F (36.7 C) (Oral)   Resp 18   Ht 5\' 6"  (1.676 m)   Wt 88.9 kg   LMP 03/18/2019 (Exact Date) Comment: irregular menses  SpO2 99%   Breastfeeding Unknown   BMI 31.64 kg/m   Physical Exam: Gen: NAD Fundus Fundal Tone: Firm  Lochia Amount: Small        Data Review Labs: Lab Results  Component Value Date   WBC 13.0 (H) 12/02/2019   HGB 11.3 (L) 12/02/2019   HCT 32.6 (L) 12/02/2019   MCV 84.7 12/02/2019   PLT 271 12/02/2019   CBC Latest Ref Rng & Units 12/02/2019 12/01/2019 11/23/2019  WBC 4.0 - 10.5 K/uL 13.0(H) 17.2(H) 14.2(H)  Hemoglobin 12.0 - 15.0 g/dL 11.3(L) 13.1 13.6  Hematocrit 36.0 - 46.0 % 32.6(L) 37.6 39.4  Platelets 150 - 400 K/uL 271 272 235   O POS  Edinburgh Score: Edinburgh Postnatal Depression Scale Screening Tool 12/03/2019  I have been able to laugh and see the funny side of things. 0  I have looked forward with enjoyment to things. 1  I have blamed myself unnecessarily when things went wrong. 2  I have been anxious or worried for no good reason. 2  I have felt scared or panicky for no good reason. 2  Things have been getting on top of me. 1  I have been so unhappy that I have had difficulty sleeping. 1  I have felt sad or miserable. 1  I have been so unhappy that I have been crying. 1  The thought of harming myself has occurred to me. 0  Edinburgh Postnatal Depression Scale Total 11    Assessment/Plan  Active Problems:   Labor and delivery,  indication for care    Plan for discharge today.  Discharge Instructions: Per After Visit Summary. Activity: Advance as tolerated. Pelvic rest for 6 weeks.  Also refer to After Visit Summary Diet: Regular Medications: Allergies as of 12/03/2019   No Known Allergies     Medication List    STOP taking these medications   aspirin EC 81 MG tablet   NIFEdipine 10 MG capsule Commonly known as: PROCARDIA Replaced by: NIFEdipine 30 MG 24 hr tablet     TAKE these medications   Accu-Chek FastClix Lancets Misc 1 Units by Percutaneous route 4 (four) times daily. Test blood sugar 4 times daily. Before breakfast and 2 hours after every meal.   acetaminophen 325 MG tablet Commonly known as: TYLENOL Take 2 tablets (650 mg total) by mouth every 4 (four) hours as needed (for pain scale < 4  OR  temperature  >/=  100.5 F).   docusate sodium 100 MG capsule Commonly known as: COLACE Take 100 mg by mouth 2 (two) times daily as needed for mild constipation.   glucose blood test strip Test blood sugar 4 times daily. Before breakfast,2 hours after every meal.   glyBURIDE 5 MG tablet Commonly known as: DIABETA Take 1 tablet (  5 mg total) by mouth every evening. What changed: Another medication with the same name was changed. Make sure you understand how and when to take each.   glyBURIDE 2.5 MG tablet Commonly known as: DIABETA Take 3 tablets (7.5 mg total) by mouth daily with breakfast. Start taking on: December 04, 2019 What changed: medication strength   ibuprofen 600 MG tablet Commonly known as: ADVIL Take 1 tablet (600 mg total) by mouth every 6 (six) hours.   multivitamin-prenatal 27-0.8 MG Tabs tablet Take 1 tablet by mouth daily.   NIFEdipine 30 MG 24 hr tablet Commonly known as: ADALAT CC Take 1 tablet (30 mg total) by mouth 2 (two) times daily. Replaces: NIFEdipine 10 MG capsule   oxyCODONE-acetaminophen 5-325 MG tablet Commonly known as: PERCOCET/ROXICET Take 1-2 tablets by  mouth every 4 (four) hours as needed for moderate pain.   psyllium 58.6 % packet Commonly known as: METAMUCIL Take 1 packet by mouth daily.      Outpatient follow up: one week blood pressure check, 2 wks tele visit , 6 week postpartum check with Marcelino Duster Postpartum contraception: postpartum tubal done  Discharged Condition: good  Discharged to: home  Newborn Data: Disposition:NICU  Apgars: APGAR (1 MIN): 8   APGAR (5 MINS): 9   APGAR (10 MINS):    Baby Feeding: Bottle and Breast    Doreene Burke, CNM  12/03/2019 10:34 AM

## 2019-12-04 ENCOUNTER — Ambulatory Visit: Payer: Self-pay

## 2019-12-04 NOTE — Lactation Note (Signed)
This note was copied from a baby's chart. Lactation Consultation Note  Patient Name: Girl Honesty Menta [id number] Date: 12/04/2019   Mom came in to visit baby today and brought 200 ml expressed breast milk.  Mom reports fuller breast, but no engorgement.  Mom reports pumping around every 3 hours.  Mom fed baby her expressed milk via bottle stating she did not have time to breast feed or pump because her ride was waiting for her.  Praised mom for her commitment to supply breast milk for her baby.  Mom reports probably being able to take her home tomorrow.  Encouraged mom to call with any questions, concerns or if assistance needed.  Maternal Data    Feeding    LATCH Score                   Interventions    Lactation Tools Discussed/Used     Consult Status      [name] 12/04/2019, 5:25 PM

## 2019-12-05 ENCOUNTER — Ambulatory Visit: Payer: Self-pay

## 2019-12-05 NOTE — Lactation Note (Incomplete)
This note was copied from a baby's chart. Lactation Consultation Note  Patient Name: Janice Kaufman [id number] Date: 12/05/2019   Spoke with mom before baby was discharged from [facility].  Mom went to 3pm [facility] appointment and got [facility] pump.  She came straight from [facility] to the hospital to pick up baby.  Lactation numbers have been given for mom to call and let us know when she will be returning pump.  Told mom she could leave it at the desk in the medical mall with lactation's number so they can call us to come down and pick it up.    Maternal Data    Feeding    LATCH Score                   Interventions    Lactation Tools Discussed/Used     Consult Status      [name] 12/05/2019, 6:08 PM

## 2019-12-05 NOTE — Lactation Note (Signed)
This note was copied from a baby's chart. Lactation Consultation Note  Patient Name: Janice Kaufman [id number] Date: 12/05/2019   Spoke with FOB while he was visiting baby in [place] today.  He believes they will be taking baby home today.  He thinks that mom has an appointment with [employer]IC to get a DEBP but is not sure when.  Havery Moros at [facility].  She has recorded that mom has an appointment to get [facility] pump today at 3 pm.  Will recheck with mom before baby is discharged.   Maternal Data    Feeding Feeding Type: Bottle Fed - Formula Nipple Type: Slow - flow  LATCH Score                   Interventions    Lactation Tools Discussed/Used     Consult Status      Janice Kaufman 12/05/2019, 3:03 PM

## 2019-12-09 ENCOUNTER — Other Ambulatory Visit: Payer: Self-pay

## 2019-12-09 ENCOUNTER — Ambulatory Visit (INDEPENDENT_AMBULATORY_CARE_PROVIDER_SITE_OTHER): Payer: Medicaid Other | Admitting: Certified Nurse Midwife

## 2019-12-09 ENCOUNTER — Encounter: Payer: Self-pay | Admitting: Certified Nurse Midwife

## 2019-12-09 VITALS — BP 120/85 | HR 120 | Ht 66.0 in | Wt 181.6 lb

## 2019-12-09 DIAGNOSIS — Z1331 Encounter for screening for depression: Secondary | ICD-10-CM

## 2019-12-09 DIAGNOSIS — Z0130 Encounter for examination of blood pressure without abnormal findings: Secondary | ICD-10-CM | POA: Diagnosis not present

## 2019-12-09 NOTE — Progress Notes (Signed)
GYN ENCOUNTER NOTE  Subjective:       Janice Kaufman is a 29 y.o. [phone] female here for postpartum blood pressure check one week after preterm vaginal birth.   History of pre-eclampsia and Type 2 diabetes.   No blurred vision, headache or right upper quadrant pain.   Denies difficulty breathing or respiratory distress, chest pain, abdominal pain, excessive vaginal bleeding, dysuria, and leg pain or swelling.    Gynecologic History  No LMP recorded. (Menstrual status: Lactating).  Contraception: tubal ligation  Last Pap: 10/2019. Results were: abnormal, needs repeat colposcopy 3 months postpartum  Obstetric History  OB History  Gravida Para Term Preterm AB Living  5 4 3 1 1 4   SAB TAB Ectopic Multiple Live Births  1     0 4    # Outcome Date GA Lbr Len/2nd Weight Sex Delivery Anes PTL Lv  5 Preterm 12/01/19 [redacted]w[redacted]d / 10:00 7 lb 9.3 oz (3.44 kg) F Vag-Spont EPI  LIV  4 Term 2018   7 lb 7 oz (3.374 kg) M Vag-Spont   LIV  3 SAB 2016 [redacted]w[redacted]d         2 Term 2013   8 lb 7 oz (3.827 kg) M Vag-Spont   LIV  1 Term 2012   6 lb 3 oz (2.807 kg) F Vag-Spont   LIV    Past Medical History:  Diagnosis Date  . Abnormal Pap smear of cervix   . Anxiety   . Depression   . Diabetes mellitus ([place])    Type 2  . History of abnormal cervical Pap smear   . History of gestational hypertension   . History of pre-eclampsia   . History of urinary tract infection     Past Surgical History:  Procedure Laterality Date  . COLPOSCOPY    . HEMORRHOID SURGERY N/A 10/26/2019   Procedure: HEMORRHOIDECTOMY;  Surgeon: Robert Bellow, MD;  Location: [facility] ORS;  Service: General;  Laterality: N/A;  . TUBAL LIGATION Bilateral 12/02/2019   Procedure: POST PARTUM TUBAL LIGATION;  Surgeon: Harlin Heys, MD;  Location: [facility] ORS;  Service: Gynecology;  Laterality: Bilateral;    Current Outpatient Medications on File Prior to Visit  Medication Sig Dispense Refill  . Accu-Chek FastClix Lancets MISC 1  Units by Percutaneous route 4 (four) times daily. Test blood sugar 4 times daily. Before breakfast and 2 hours after every meal. 100 each 12  . acetaminophen (TYLENOL) 325 MG tablet Take 2 tablets (650 mg total) by mouth every 4 (four) hours as needed (for pain scale < 4  OR  temperature  >/=  100.5 F). 60 tablet 0  . docusate sodium (COLACE) 100 MG capsule Take 100 mg by mouth 2 (two) times daily as needed for mild constipation.    Marland Kitchen glucose blood test strip Test blood sugar 4 times daily. Before breakfast,2 hours after every meal. 100 each 12  . glyBURIDE (DIABETA) 2.5 MG tablet Take 3 tablets (7.5 mg total) by mouth daily with breakfast. 30 tablet 5  . glyBURIDE (DIABETA) 5 MG tablet Take 1 tablet (5 mg total) by mouth every evening. 30 tablet 3  . NIFEdipine (ADALAT CC) 30 MG 24 hr tablet Take 1 tablet (30 mg total) by mouth 2 (two) times daily. 60 tablet 5  . oxyCODONE-acetaminophen (PERCOCET/ROXICET) 5-325 MG tablet Take 1-2 tablets by mouth every 4 (four) hours as needed for moderate pain. 10 tablet 0  . Prenatal Vit-Fe Fumarate-FA (MULTIVITAMIN-PRENATAL) 27-0.8 MG TABS tablet Take  1 tablet by mouth daily.     . psyllium (METAMUCIL) 58.6 % packet Take 1 packet by mouth daily.    Marland Kitchen ibuprofen (ADVIL) 600 MG tablet Take 1 tablet (600 mg total) by mouth every 6 (six) hours. (Patient not taking: Reported on 12/09/2019) 30 tablet 0   No current facility-administered medications on file prior to visit.    No Known Allergies  Social History   Socioeconomic History  . Marital status: Legally Separated    Spouse name: Not on file  . Number of children: Not on file  . Years of education: Not on file  . Highest education level: Not on file  Occupational History  . Not on file  Tobacco Use  . Smoking status: Current Every Day Smoker    Packs/day: 0.50    Years: 10.00    Pack years: 5.00    Types: Cigarettes  . Smokeless tobacco: Never Used  Substance and Sexual Activity  . Alcohol use:  Not Currently    Comment: Last ETOH use one year ago (in 2019)  . Drug use: Never  . Sexual activity: Yes    Birth control/protection: None    Comment: Last BCM (ocps) used 04/2019  Other Topics Concern  . Not on file  Social History Narrative  . Not on file   Social Determinants of Health   Financial Resource Strain:   . Difficulty of Paying Living Expenses:   Food Insecurity:   . Worried About Programme researcher, broadcasting/film/video in the Last Year:   . Barista in the Last Year:   Transportation Needs:   . Freight forwarder (Medical):   Marland Kitchen Lack of Transportation (Non-Medical):   Physical Activity:   . Days of Exercise per Week:   . Minutes of Exercise per Session:   Stress:   . Feeling of Stress :   Social Connections:   . Frequency of Communication with Friends and Family:   . Frequency of Social Gatherings with Friends and Family:   . Attends Religious Services:   . Active Member of Clubs or Organizations:   . Attends Banker Meetings:   Marland Kitchen Marital Status:   Intimate Partner Violence: Not At Risk  . Fear of Current or Ex-Partner: No  . Emotionally Abused: No  . Physically Abused: No  . Sexually Abused: No    Family History  Problem Relation Age of Onset  . Cancer Mother   . Diabetes Mother   . Thyroid disease Mother   . Ovarian cancer Paternal Grandmother   . Diabetes Paternal Grandmother     The following portions of the patient's history were reviewed and updated as appropriate: allergies, current medications, past family history, past medical history, past social history, past surgical history and problem list.  Review of Systems  ROS negative except as noted above. Information obtained from patient.   Objective:   BP 120/85   Pulse (!) 120   Ht 5\' 6"  (1.676 m)   Wt 181 lb 9 oz (82.4 kg)   BMI 29.30 kg/m    CONSTITUTIONAL: Well-developed, well-nourished female in no acute distress.   MUSCULOSKELETAL: Normal range of motion. No tenderness.   No cyanosis, clubbing, or edema.  Edinburgh Postnatal Depression Scale Screening Tool 12/09/2019 12/03/2019 12/02/2019 12/01/2019  I have been able to laugh and see the funny side of things. 0 0 0 (No Data)  I have looked forward with enjoyment to things. 0 1 1 -  I have  blamed myself unnecessarily when things went wrong. 2 2 2  -  I have been anxious or worried for no good reason. 3 2 2  -  I have felt scared or panicky for no good reason. 2 2 2  -  Things have been getting on top of me. 0 1 1 -  I have been so unhappy that I have had difficulty sleeping. 0 1 1 -  I have felt sad or miserable. 0 1 1 -  I have been so unhappy that I have been crying. 0 1 1 -  The thought of harming myself has occurred to me. 0 0 0 -  Edinburgh Postnatal Depression Scale Total 7 11 11  -     Assessment:   1. Blood pressure check   2. Postpartum care and examination   3. Depression screen   Plan:   Continue procardia as prescribed.   Reviewed red flag symptoms and when to call.   Follow up as previously scheduled or sooner if needed.    , CNM [facility], [facility] 12/09/19 2:04 PM

## 2019-12-09 NOTE — Patient Instructions (Signed)
Postpartum Hypertension Postpartum hypertension is high blood pressure that remains higher than normal after childbirth. You may not realize that you have postpartum hypertension if your blood pressure is not being checked regularly. In most cases, postpartum hypertension will go away on its own, usually within a week of delivery. However, for some women, medical treatment is required to prevent serious complications, such as seizures or stroke. What are the causes? This condition may be caused by one or more of the following:  Hypertension that existed before pregnancy (chronic hypertension).  Hypertension that comes on as a result of pregnancy (gestational hypertension).  Hypertensive disorders during pregnancy (preeclampsia) or seizures in women who have high blood pressure during pregnancy (eclampsia).  A condition in which the liver, platelets, and red blood cells are damaged during pregnancy (HELLP syndrome).  A condition in which the thyroid produces too much hormones (hyperthyroidism).  Other rare problems of the nerves (neurological disorders) or blood disorders. In some cases, the cause may not be known. What increases the risk? The following factors may make you more likely to develop this condition:  Chronic hypertension. In some cases, this may not have been diagnosed before pregnancy.  Obesity.  Type 2 diabetes.  Kidney disease.  History of preeclampsia or eclampsia.  Other medical conditions that change the level of hormones in the body (hormonal imbalance). What are the signs or symptoms? As with all types of hypertension, postpartum hypertension may not have any symptoms. Depending on how high your blood pressure is, you may experience:  Headaches. These may be mild, moderate, or severe. They may also be steady, constant, or sudden in onset (thunderclap headache).  Changes in your ability to see (visual changes).  Dizziness.  Shortness of breath.  Swelling  of your hands, feet, lower legs, or face. In some cases, you may have swelling in more than one of these locations.  Heart palpitations or a racing heartbeat.  Difficulty breathing while lying down.  Decrease in the amount of urine that you pass. Other rare signs and symptoms may include:  Sweating more than usual. This lasts longer than a few days after delivery.  Chest pain.  Sudden dizziness when you get up from sitting or lying down.  Seizures.  Nausea or vomiting.  Abdominal pain. How is this diagnosed? This condition may be diagnosed based on the results of a physical exam, blood pressure measurements, and blood and urine tests. You may also have other tests, such as a CT scan or an MRI, to check for other problems of postpartum hypertension. How is this treated? If blood pressure is high enough to require treatment, your options may include:  Medicines to reduce blood pressure (antihypertensives). Tell your health care provider if you are breastfeeding or if you plan to breastfeed. There are many antihypertensive medicines that are safe to take while breastfeeding.  Stopping medicines that may be causing hypertension.  Treating medical conditions that are causing hypertension.  Treating the complications of hypertension, such as seizures, stroke, or kidney problems. Your health care provider will also continue to monitor your blood pressure closely until it is within a safe range for you. Follow these instructions at home:  Take over-the-counter and prescription medicines only as told by your health care provider.  Return to your normal activities as told by your health care provider. Ask your health care provider what activities are safe for you.  Do not use any products that contain nicotine or tobacco, such as cigarettes and e-cigarettes. If   you need help quitting, ask your health care provider.  Keep all follow-up visits as told by your health care provider. This  is important. Contact a health care provider if:  Your symptoms get worse.  You have new symptoms, such as: ? A headache that does not get better. ? Dizziness. ? Visual changes. Get help right away if:  You suddenly develop swelling in your hands, ankles, or face.  You have sudden, rapid weight gain.  You develop difficulty breathing, chest pain, racing heartbeat, or heart palpitations.  You develop severe pain in your abdomen.  You have any symptoms of a stroke. "BE FAST" is an easy way to remember the main warning signs of a stroke: ? B - Balance. Signs are dizziness, sudden trouble walking, or loss of balance. ? E - Eyes. Signs are trouble seeing or a sudden change in vision. ? F - Face. Signs are sudden weakness or numbness of the face, or the face or eyelid drooping on one side. ? A - Arms. Signs are weakness or numbness in an arm. This happens suddenly and usually on one side of the body. ? S - Speech. Signs are sudden trouble speaking, slurred speech, or trouble understanding what people say. ? T - Time. Time to call emergency services. Write down what time symptoms started.  You have other signs of a stroke, such as: ? A sudden, severe headache with no known cause. ? Nausea or vomiting. ? Seizure. These symptoms may represent a serious problem that is an emergency. Do not wait to see if the symptoms will go away. Get medical help right away. Call your local emergency services (911 in the [place] Do not drive yourself to the hospital. Summary  Postpartum hypertension is high blood pressure that remains higher than normal after childbirth.  In most cases, postpartum hypertension will go away on its own, usually within a week of delivery.  For some women, medical treatment is required to prevent serious complications, such as seizures or stroke. This information is not intended to replace advice given to you by your health care provider. Make sure you discuss any questions  you have with your health care provider. Document Revised: 10/08/2018 Document Reviewed: 06/22/2017 Elsevier Patient Education  2020 Elsevier Inc.  

## 2019-12-12 ENCOUNTER — Encounter: Payer: Medicaid Other | Admitting: Certified Nurse Midwife

## 2019-12-19 ENCOUNTER — Other Ambulatory Visit: Payer: Self-pay

## 2019-12-19 ENCOUNTER — Encounter: Payer: Self-pay | Admitting: Certified Nurse Midwife

## 2019-12-19 ENCOUNTER — Ambulatory Visit (INDEPENDENT_AMBULATORY_CARE_PROVIDER_SITE_OTHER): Payer: Medicaid Other | Admitting: Certified Nurse Midwife

## 2019-12-19 DIAGNOSIS — Z1331 Encounter for screening for depression: Secondary | ICD-10-CM

## 2019-12-19 DIAGNOSIS — E1165 Type 2 diabetes mellitus with hyperglycemia: Secondary | ICD-10-CM | POA: Diagnosis not present

## 2019-12-19 DIAGNOSIS — Z8482 Family history of sudden infant death syndrome: Secondary | ICD-10-CM | POA: Insufficient documentation

## 2019-12-19 DIAGNOSIS — Z8759 Personal history of other complications of pregnancy, childbirth and the puerperium: Secondary | ICD-10-CM

## 2019-12-19 NOTE — Progress Notes (Signed)
Televisit- Pt present for follow up postpartum care. Pt stated that her baby passed away this weekend. Pt was really upset and crying. EPDS=29.

## 2019-12-19 NOTE — Progress Notes (Signed)
Virtual Visit via Telephone Note  I connected with Janice Kaufman on 12/19/19 at 11:30 AM EDT by telephone and verified that I am speaking with the correct person using two identifiers.  Location:  Patient: Janice Kaufman  Provider: Gunnar Bulla, CNM   I discussed the limitations, risks, security and privacy concerns of performing an evaluation and management service by telephone and the availability of in person appointments. I also discussed with the patient that there may be a patient responsible charge related to this service. The patient expressed understanding and agreed to proceed.   History of Present Illness:  Patient two (2) week postpartum vaginal birth of liveborn female infant. Taking Pocardia daily.     Observations/Objective:  Patient crying. Reports infant found not breathing this Saturday. Considering cremation and service in [place], [place]. Donating heart valves. No plan to current hurt self.   Edinburgh Postnatal Depression Scale Screening Tool 12/19/2019 12/09/2019 12/03/2019 12/02/2019 12/01/2019  I have been able to laugh and see the funny side of things. 3 0 0 0 (No Data)  I have looked forward with enjoyment to things. 3 0 1 1 -  I have blamed myself unnecessarily when things went wrong. 3 2 2 2  -  I have been anxious or worried for no good reason. 3 3 2 2  -  I have felt scared or panicky for no good reason. 3 2 2 2  -  Things have been getting on top of me. 3 0 1 1 -  I have been so unhappy that I have had difficulty sleeping. 3 0 1 1 -  I have felt sad or miserable. 3 0 1 1 -  I have been so unhappy that I have been crying. 3 0 1 1 -  The thought of harming myself has occurred to me. 2 0 0 0 -  Edinburgh Postnatal Depression Scale Total 29 7 11 11  -     Assessment and Plan:  Two (2) weeks postpartum visit Depression screen Type 2 Diabetes History of pre-eclampsia Family history of SIDS  Follow Up Instructions:  Condolescences given.    Number given for [facility]atal [facility] [phone].   Contacted Rose, Pregnancy Case Manager, and information given.   Reviewed red flag symptoms and when to call.   RTC as previously scheduled or sooner if needed.    I discussed the assessment and treatment plan with the patient. The patient was provided an opportunity to ask questions and all were answered. The patient agreed with the plan and demonstrated an understanding of the instructions.   The patient was advised to call back or seek an in-person evaluation if the symptoms worsen or if the condition fails to improve as anticipated.  I provided 20 minutes of non-face-to-face time during this encounter.   , CNM [facility], [facility] 12/19/19 1:28 PM

## 2020-01-12 ENCOUNTER — Telehealth: Payer: Self-pay

## 2020-01-12 ENCOUNTER — Encounter: Payer: Medicaid Other | Admitting: Certified Nurse Midwife

## 2020-01-12 NOTE — Telephone Encounter (Signed)
Voicemail message left for patient to please return my call. Patient had an appointment today that she did not come for. Will send mychart message as well.

## 2021-10-09 IMAGING — US US OB LIMITED
1 series · 14 of 28 positions shown · non-contrast
Comparison: none

CLINICAL DATA: Vaginal bleeding. Gestational age by LMP is 33 weeks
and 2 days.

EXAM:
LIMITED OBSTETRIC ULTRASOUND

[Series 1: us ob limited · 32 acquisitions, 14 frames shown]
[im 2/32]
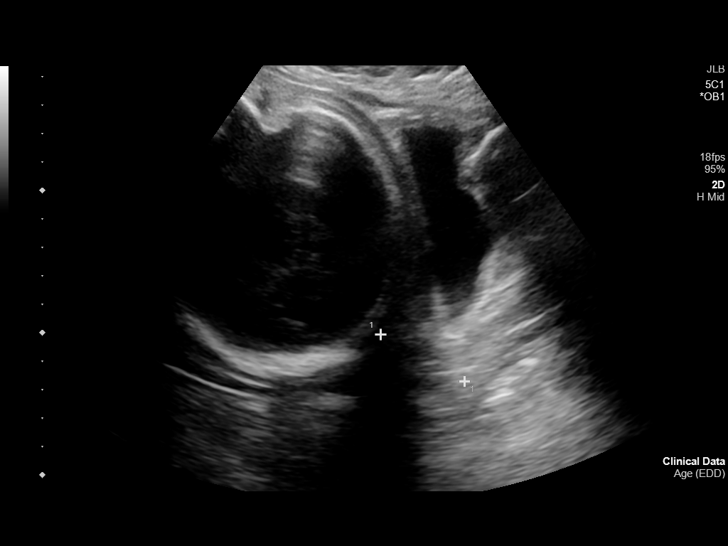
[im 4/32]
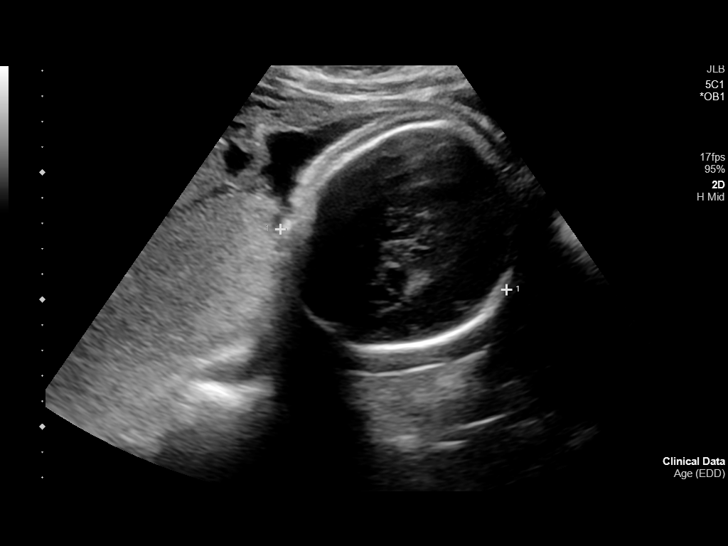
[im 6/32]
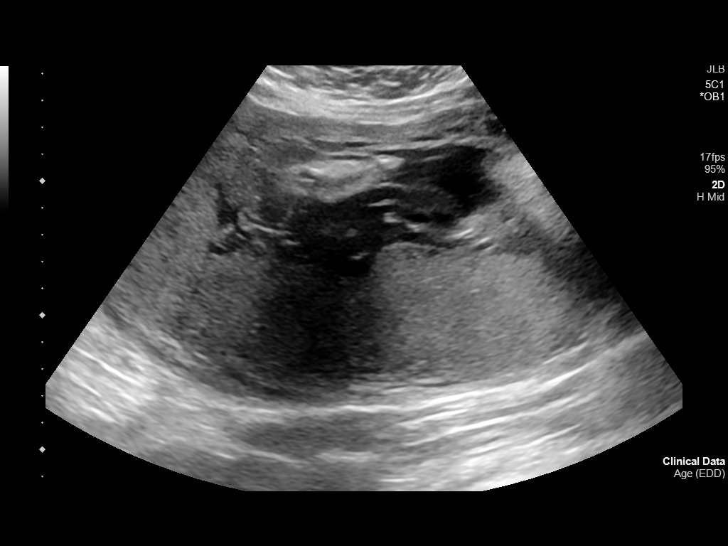
[im 9/32]
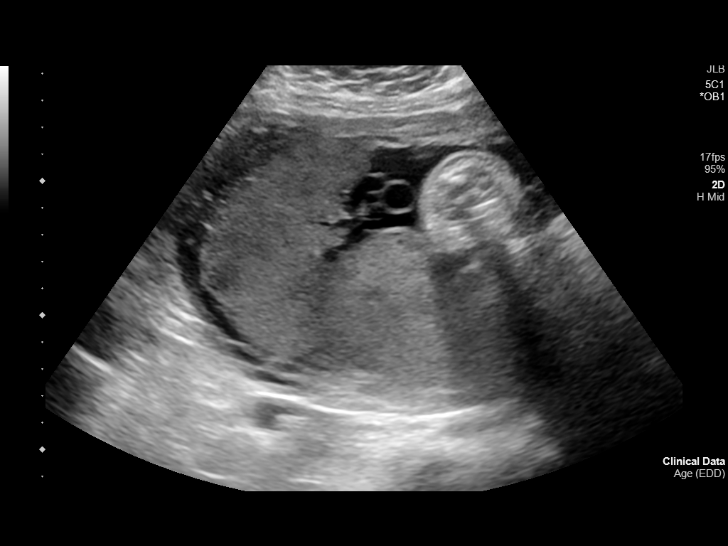
[im 11/32]
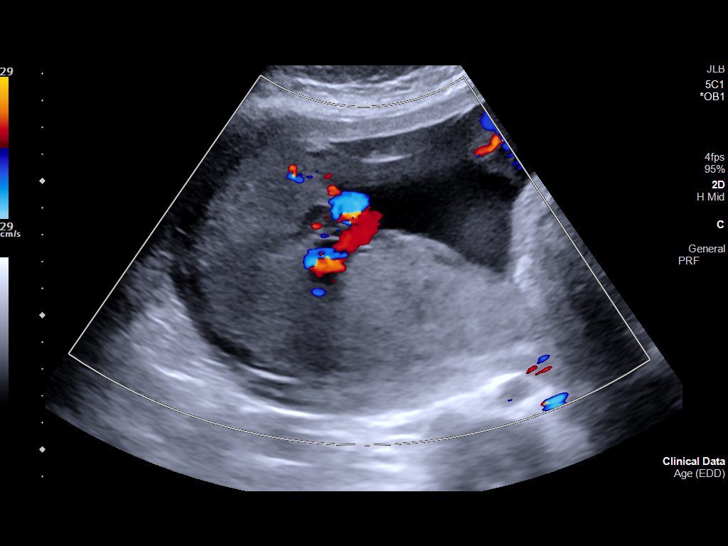
[im 13/32]
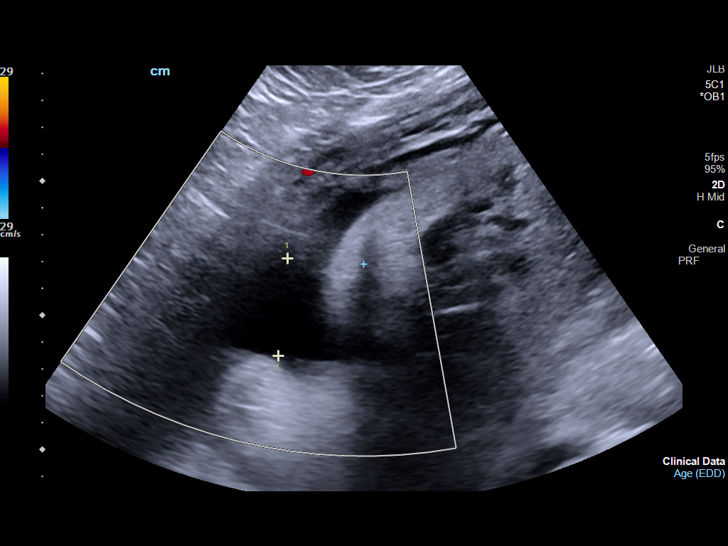
[im 15/32]
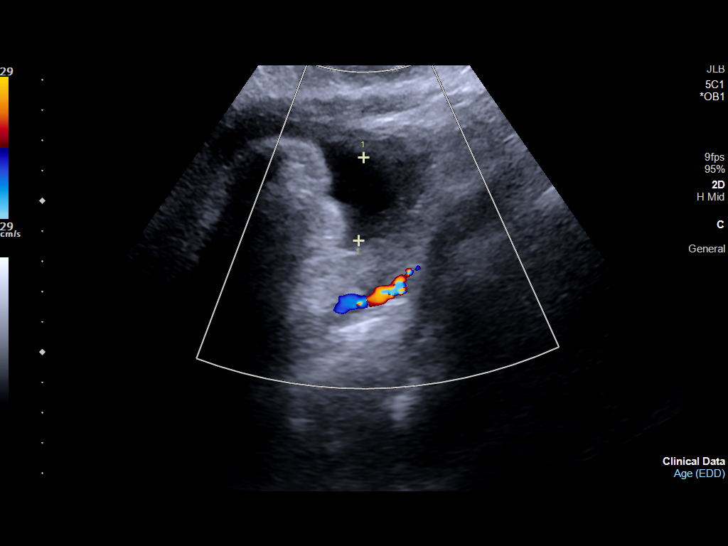
[im 18/32]
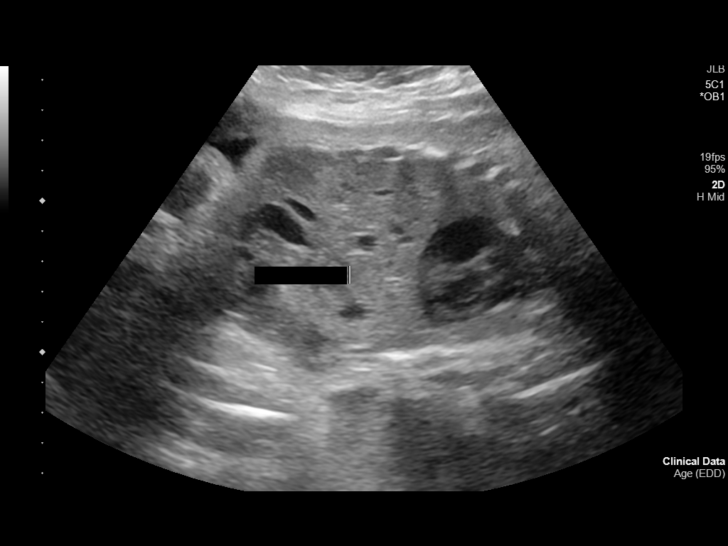
[im 20/32]
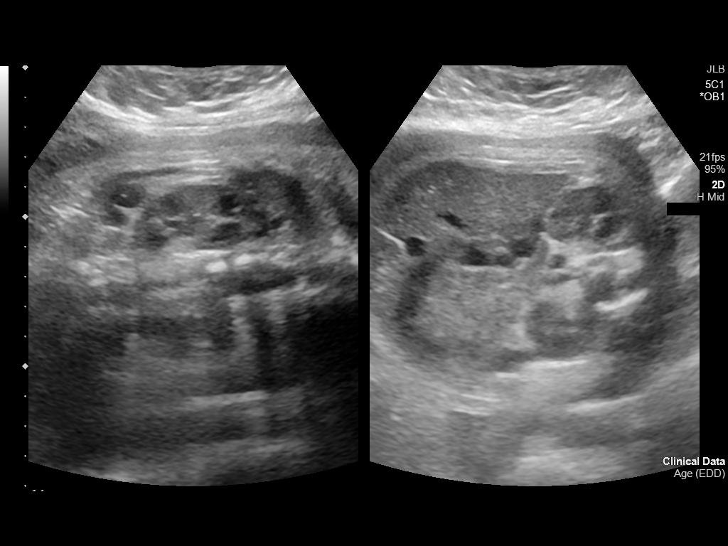
[im 22/32]
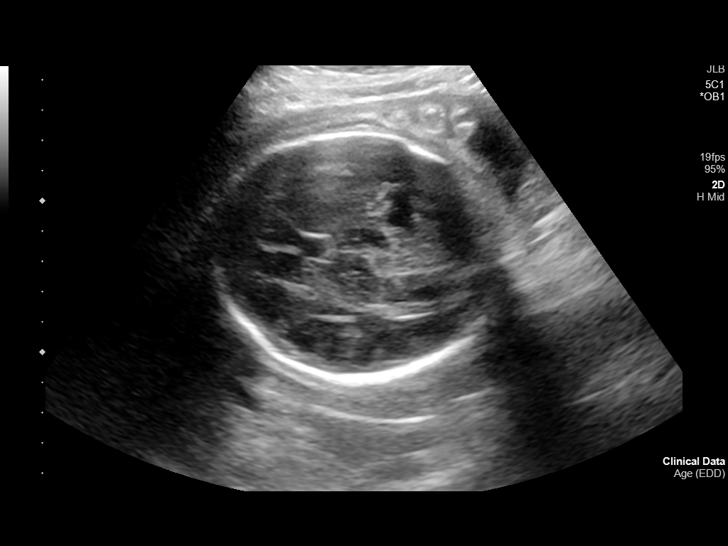
[im 25/32]
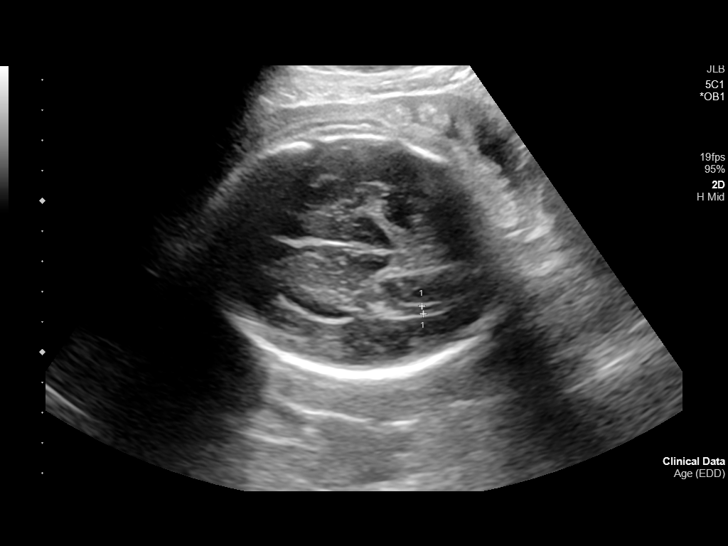
[im 27/32]
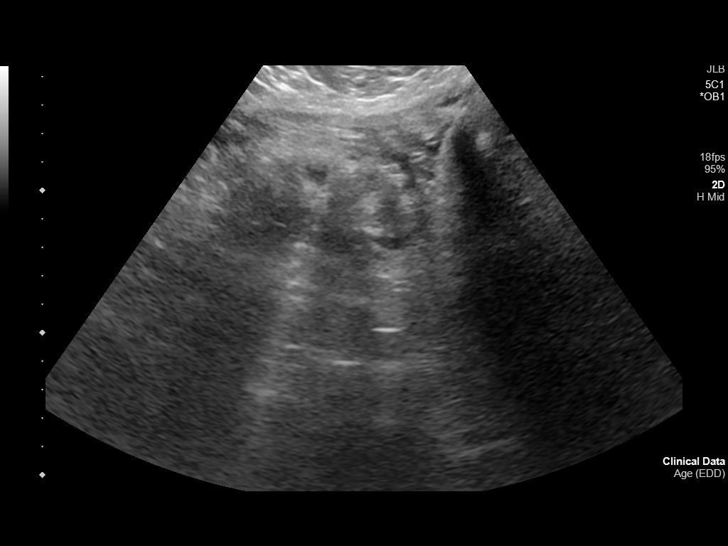
[im 29/32]
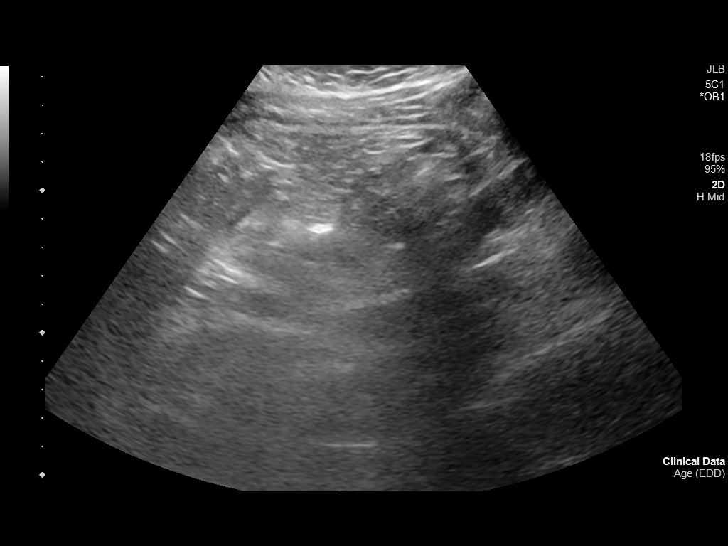
[im 32/32]
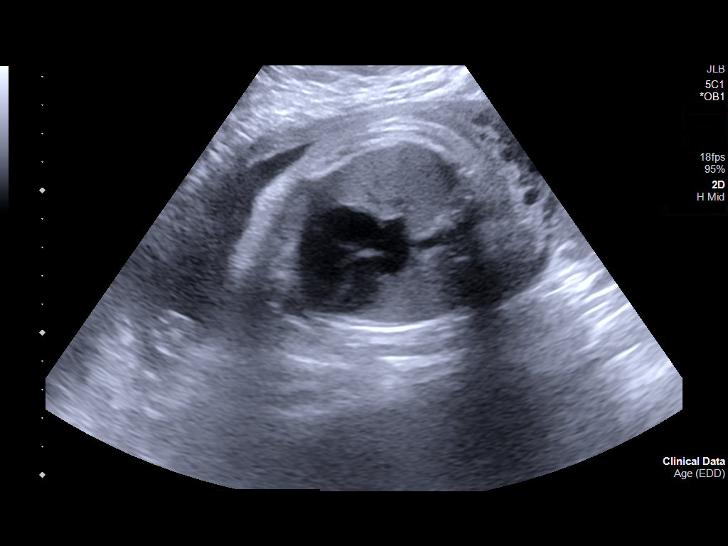

[14 of 28 positions shown; findings below may reference images not displayed]

FINDINGS: Number of Fetuses: 1

Heart Rate:  136 bpm

Movement: Yes

Presentation: Cephalic

Placental Location: Fundal

Previa: No

Amniotic Fluid (Subjective):  Within normal limits.

AFI: 14.4 cm

BPD: 7.87 cm 31 w  for d

MATERNAL FINDINGS:

Cervix:  Appears closed.

Uterus/Adnexae: No abnormality visualized.
IMPRESSION: No abnormal findings to explain vaginal bleeding.

This exam is performed on an emergent basis and does not
comprehensively evaluate fetal size, dating, or anatomy; follow-up
complete OB US should be considered if further fetal assessment is
warranted.

## 2022-06-30 ENCOUNTER — Encounter: Payer: Self-pay | Admitting: Certified Nurse Midwife

## 2023-01-21 LAB — LAB REPORT - SCANNED: EGFR: 121

## 2023-11-11 ENCOUNTER — Other Ambulatory Visit: Payer: Self-pay

## 2023-11-11 ENCOUNTER — Emergency Department
Admission: EM | Admit: 2023-11-11 | Discharge: 2023-11-11 | Disposition: A | Discharge status: Home / Self Care | Payer: MEDICAID | Attending: Emergency Medicine | Admitting: Emergency Medicine

## 2023-11-11 ENCOUNTER — Emergency Department: Payer: MEDICAID

## 2023-11-11 DIAGNOSIS — I10 Essential (primary) hypertension: Secondary | ICD-10-CM | POA: Insufficient documentation

## 2023-11-11 DIAGNOSIS — R079 Chest pain, unspecified: Secondary | ICD-10-CM | POA: Insufficient documentation

## 2023-11-11 DIAGNOSIS — L03313 Cellulitis of chest wall: Secondary | ICD-10-CM | POA: Insufficient documentation

## 2023-11-11 DIAGNOSIS — R739 Hyperglycemia, unspecified: Secondary | ICD-10-CM

## 2023-11-11 DIAGNOSIS — N61 Mastitis without abscess: Secondary | ICD-10-CM

## 2023-11-11 DIAGNOSIS — N644 Mastodynia: Secondary | ICD-10-CM

## 2023-11-11 DIAGNOSIS — E1165 Type 2 diabetes mellitus with hyperglycemia: Secondary | ICD-10-CM | POA: Insufficient documentation

## 2023-11-11 LAB — TROPONIN I (HIGH SENSITIVITY): Troponin I (High Sensitivity): 4 ng/L (ref <18)

## 2023-11-11 LAB — CBC
HCT: 50.9 % — ABNORMAL HIGH (ref 36.0–46.0)
Hemoglobin: 17.6 g/dL — ABNORMAL HIGH (ref 12.0–15.0)
MCH: 27.8 pg (ref 26.0–34.0)
MCHC: 34.6 g/dL (ref 30.0–36.0)
MCV: 80.4 fL (ref 80.0–100.0)
Platelets: 345 10*3/uL (ref 150–400)
RBC: 6.33 MIL/uL — ABNORMAL HIGH (ref 3.87–5.11)
RDW: 13 % (ref 11.5–15.5)
WBC: 13.2 10*3/uL — ABNORMAL HIGH (ref 4.0–10.5)
nRBC: 0 % (ref 0.0–0.2)

## 2023-11-11 LAB — BASIC METABOLIC PANEL
Anion gap: 11 (ref 5–15)
BUN: 14 mg/dL (ref 6–20)
CO2: 20 mmol/L — ABNORMAL LOW (ref 22–32)
Calcium: 9.3 mg/dL (ref 8.9–10.3)
Chloride: 104 mmol/L (ref 98–111)
Creatinine, Ser: 0.67 mg/dL (ref 0.44–1.00)
GFR, Estimated: >60 mL/min (ref >60)
Glucose, Bld: 413 mg/dL — ABNORMAL HIGH (ref 70–99)
Potassium: 3.7 mmol/L (ref 3.5–5.1)
Sodium: 135 mmol/L (ref 135–145)

## 2023-11-11 MED ORDER — CLINDAMYCIN HCL 300 MG PO CAPS: 300.0000 mg | ORAL_CAPSULE | Freq: Three times a day (TID) | ORAL | 0 refills | Status: AC

## 2023-11-11 MED ORDER — GLYBURIDE 2.5 MG PO TABS: 7.5000 mg | ORAL_TABLET | Freq: Every day | ORAL | 5 refills | Status: DC

## 2023-11-11 NOTE — Discharge Instructions (Signed)
 Please start the glipizide.   I was unable to find the type insulin you were on.   Someone should call you to schedule an appointment with a primary care provider. Return to the ER for symptoms that change or worsen if unable to schedule an appointment with primary care.

## 2023-11-11 NOTE — ED Provider Notes (Signed)
 [facility] Provider Note    Event Date/Time   First MD Initiated Contact with Patient 11/11/23 1402     (approximate)   History   Chest Pain and Breast Pain   HPI  Janice Kaufman is a 33 y.o. female  with history of type 2 DM, HTN,  and as listed in EMR presents to the emergency department for treatment and evaluation of chest pain and left breast pain. She also has some .      Physical Exam   Triage Vital Signs: ED Triage Vitals  Encounter Vitals Group     BP 11/11/23 1206 (!) 165/99     Systolic BP Percentile --      Diastolic BP Percentile --      Pulse Rate 11/11/23 1206 (!) 107     Resp 11/11/23 1206 18     Temp 11/11/23 1206 98.3 F (36.8 C)     Temp Source 11/11/23 1206 Oral     SpO2 11/11/23 1206 98 %     Weight 11/11/23 1204 188 lb 15 oz (85.7 kg)     Height 11/11/23 1204 5\' 6"  (1.676 m)     Head Circumference --      Peak Flow --      Pain Score 11/11/23 1204 6     Pain Loc --      Pain Education --      Exclude from Growth Chart --     Most recent vital signs: Vitals:   11/11/23 1206 11/11/23 1406  BP: (!) 165/99 (!) 146/97  Pulse: (!) 107 92  Resp: 18 15  Temp: 98.3 F (36.8 C)   SpO2: 98% 98%    General: Awake, no distress. *** CV:  Good peripheral perfusion. *** Resp:  Normal effort. *** Abd:  No distention. *** Other:  ***   ED Results / Procedures / Treatments   Labs (all labs ordered are listed, but only abnormal results are displayed) Labs Reviewed  BASIC METABOLIC PANEL - Abnormal; Notable for the following components:      Result Value   CO2 20 (*)    Glucose, Bld 413 (*)    All other components within normal limits  CBC - Abnormal; Notable for the following components:   WBC 13.2 (*)    RBC 6.33 (*)    Hemoglobin 17.6 (*)    HCT 50.9 (*)    All other components within normal limits  POC URINE PREG, ED  TROPONIN I (HIGH SENSITIVITY)  TROPONIN I (HIGH SENSITIVITY)     EKG  Sinus  tachycardia, rate 109.   RADIOLOGY  Image and radiology report reviewed and interpreted by me. Radiology report consistent with the same.  ***  PROCEDURES:  Critical Care performed: {CriticalCareYesNo:19197::"Yes, see critical care procedure note(s)","No"}  Procedures   MEDICATIONS ORDERED IN ED:  Medications - No data to display   IMPRESSION / MDM / ASSESSMENT AND PLAN / ED COURSE   I have reviewed the triage note.  Differential diagnosis includes, but is not limited to, ***  Patient's presentation is most consistent with {EM COPA:27473}  {**The patient is on the cardiac monitor to evaluate for evidence of arrhythmia and/or significant heart rate changes.**}  Clinical Course as of 11/11/23 1504  Wed Nov 11, 2023  1416 ED EKG [BK]    Clinical Course User Index [BK] Elon Jester, [place], [place]     FINAL CLINICAL IMPRESSION(S) / ED DIAGNOSES   Final diagnoses:  None  Rx / DC Orders   ED Discharge Orders     None        Note:  This document was prepared using Dragon voice recognition software and may include unintentional dictation errors.

## 2023-11-11 NOTE — ED Triage Notes (Signed)
 Pt here with cp for a few weeks and left breast pain that started shortly after. Pt states the pain is dull and tight and is intermittent. Pt describes breast pain as a constant tugging sensation. Pt endorses nausea and diarrhea.

## 2023-11-11 NOTE — ED Notes (Signed)
 Pt verbalizes understanding of discharge instructions. Opportunity for questioning and answers were provided. Pt discharged from ED to home by self.

## 2024-02-17 LAB — LIPID PANEL
Cholesterol: 189 (ref 0–200)
HDL: 28 — AB (ref 35–70)
LDL Cholesterol: 119
Triglycerides: 235 — AB (ref 40–160)

## 2024-02-17 LAB — BASIC METABOLIC PANEL WITH GFR
BUN: 10 (ref 4–21)
CO2: 18 (ref 13–22)
Chloride: 100 (ref 99–108)
Creatinine: 0.6 (ref 0.5–1.1)
Glucose: 335
Potassium: 4.3 meq/L (ref 3.5–5.1)
Sodium: 138 (ref 137–147)

## 2024-02-17 LAB — TSH: TSH: 2.15 (ref 0.41–5.90)

## 2024-02-17 LAB — HEPATIC FUNCTION PANEL
ALT: 20 U/L (ref 7–35)
AST: 12 — AB (ref 13–35)
Alkaline Phosphatase: 152 — AB (ref 25–125)

## 2024-02-17 LAB — HEMOGLOBIN A1C: Hemoglobin A1C: 12.7

## 2024-02-17 LAB — COMPREHENSIVE METABOLIC PANEL WITH GFR
Albumin: 4.3 (ref 3.5–5.0)
Calcium: 9.4 (ref 8.7–10.7)
Globulin: 2.8
eGFR: 125

## 2024-03-07 ENCOUNTER — Ambulatory Visit: Payer: Self-pay | Admitting: Family Medicine

## 2024-03-07 ENCOUNTER — Encounter: Payer: Self-pay | Admitting: Family Medicine

## 2024-03-07 VITALS — BP 136/78 | HR 96 | Resp 16 | Ht 66.0 in | Wt 188.0 lb

## 2024-03-07 DIAGNOSIS — Z1159 Encounter for screening for other viral diseases: Secondary | ICD-10-CM

## 2024-03-07 DIAGNOSIS — I10 Essential (primary) hypertension: Secondary | ICD-10-CM | POA: Insufficient documentation

## 2024-03-07 DIAGNOSIS — Z794 Long term (current) use of insulin: Secondary | ICD-10-CM

## 2024-03-07 DIAGNOSIS — F17200 Nicotine dependence, unspecified, uncomplicated: Secondary | ICD-10-CM

## 2024-03-07 DIAGNOSIS — F419 Anxiety disorder, unspecified: Secondary | ICD-10-CM | POA: Insufficient documentation

## 2024-03-07 DIAGNOSIS — G629 Polyneuropathy, unspecified: Secondary | ICD-10-CM | POA: Insufficient documentation

## 2024-03-07 DIAGNOSIS — Z0000 Encounter for general adult medical examination without abnormal findings: Secondary | ICD-10-CM | POA: Diagnosis not present

## 2024-03-07 DIAGNOSIS — F39 Unspecified mood [affective] disorder: Secondary | ICD-10-CM

## 2024-03-07 DIAGNOSIS — E1165 Type 2 diabetes mellitus with hyperglycemia: Secondary | ICD-10-CM

## 2024-03-07 DIAGNOSIS — F3181 Bipolar II disorder: Secondary | ICD-10-CM | POA: Insufficient documentation

## 2024-03-07 DIAGNOSIS — E66811 Obesity, class 1: Secondary | ICD-10-CM | POA: Insufficient documentation

## 2024-03-07 DIAGNOSIS — F4329 Adjustment disorder with other symptoms: Secondary | ICD-10-CM | POA: Insufficient documentation

## 2024-03-07 DIAGNOSIS — Z6830 Body mass index (BMI) 30.0-30.9, adult: Secondary | ICD-10-CM

## 2024-03-07 MED ORDER — LOSARTAN POTASSIUM-HCTZ 50-12.5 MG PO TABS: 0.5000 | ORAL_TABLET | Freq: Every day | ORAL | 0 refills | Status: DC

## 2024-03-07 MED ORDER — BUSPIRONE HCL 10 MG PO TABS: 5.0000 mg | ORAL_TABLET | Freq: Two times a day (BID) | ORAL | 1 refills | Status: DC | PRN

## 2024-03-07 MED ORDER — GABAPENTIN 100 MG PO CAPS: 100.0000 mg | ORAL_CAPSULE | Freq: Three times a day (TID) | ORAL | 1 refills | Status: DC

## 2024-03-07 MED ORDER — OZEMPIC (0.25 OR 0.5 MG/DOSE) 2 MG/3ML ~~LOC~~ SOPN: PEN_INJECTOR | SUBCUTANEOUS | 0 refills | Status: DC

## 2024-03-07 NOTE — Assessment & Plan Note (Signed)
 A1c 12.7 on 02/17/2024 per labs on pts phone She is working with [facility] who wanted her to stay off lantus and restart oral meds and work on diet I suspect she will need basal insulin  for a while while starting GLP since fasting glucose was >300 and A1c is 12.7 Discussed possible ways to approach managing - its unclear if she will manage primarily with me as PCP or continue with [facility]? She has dexcom meter and new meds per [facility] - just started glipizide and metformin  Discussed GLP-1's no hx of MTC, pancreatitis or MEN type 2 syndrome Will be requesting lab records and abstracting to chart Seems like he is having sx from hyperglycemia and uncontrolled DM including hunger/thirst polyuria, neuropathy, blurred vision Rx for low dose gabapentin  Will do DM foot exam with f/up visit

## 2024-03-07 NOTE — Patient Instructions (Addendum)
 [facility] - (see if they take your insurance) [address] [place], [place] [address] Phone Number: [phone]  Fax Number: [phone]

## 2024-03-07 NOTE — Assessment & Plan Note (Signed)
 She does not know exact dx, but it was recently diagnosed with psych clinic in [place]  we will request records She does report wide fluctuations in her moods including short periods of better moods then she states she becomes very angry and agitated then will crash into deep depressive episodes She has used BuSpar and then for only short periods of time tried Jordan but she states that it did not help her moods overall and it seemed to make her more angry We will get her established with psychiatry for assessment and medical management Family hx of bipolar - her mom and sister She briefly had ETOH use/abuse when self medicating after the death of her child She also had SI/OD and then went to outpt psych in [place]  It does not sound like she ever was committed or did any inpt psych She currently is with husband and 3 children, well supported, no SI, HI, AVH but still having depressive sx moderate and severe anxiety sx

## 2024-03-07 NOTE — Assessment & Plan Note (Signed)
 She is working on quitting and cutting back gradually

## 2024-03-07 NOTE — Progress Notes (Signed)
 Name: Janice Kaufman   MRN: [id number]    DOB: 20-Feb-1991   Date:03/07/2024       Progress Note  Chief Complaint  Patient presents with   Establish Care   Manic Behavior    Discuss her bipolar and restarting medicine.   Diabetes     Subjective:   Janice Kaufman is a 33 y.o. female, presents to clinic to est care increased She moved here from [place]  Aug 2024 She was seeing family med/GYN and psychiatry  [facility]. [facility] [place], [place]   Bipolar on latuda - community service board of [place]  mental health clinic [place], [place] Moods extreme states she would get angry and rage out  She was previously tx with anxiety and depression meds and only for a few months with psychiatry in [place] did she get a bipolar diagnosis and start latuda which she was on for a short period without improvement - she felt it almost made her sx worse   She believes she was on zoloft before     03/07/2024    9:16 AM 09/28/2019    2:43 PM 09/02/2019    2:07 PM  Depression screen PHQ 2/9  Decreased Interest 1 1 0  Down, Depressed, Hopeless 1 1 0  PHQ - 2 Score 2 2 0  Altered sleeping 3 3   Tired, decreased energy 3 2   Change in appetite  3   Feeling bad or failure about yourself  3 1   Trouble concentrating 2 0   Moving slowly or fidgety/restless 0 0   Suicidal thoughts 0 0   PHQ-9 Score 13 11   Difficult doing work/chores  Not difficult at all       03/07/2024    9:27 AM  GAD 7 : Generalized Anxiety Score  Nervous, Anxious, on Edge 3  Control/stop worrying 3  Worry too much - different things 3  Trouble relaxing 3  Restless 3  Easily annoyed or irritable 3  Afraid - awful might happen 3  Total GAD 7 Score 21   Sister and mom both have bipolar   Diabetes - restarted meds and did labs with some [facility] started glipizide and meformin previously was on lantus januvia and lantus She has labs on phone - 12.7 A1c 6/4, fasting sugar was >300, renal function was  good GFR >60        Current Outpatient Medications:    busPIRone (BUSPAR) 10 MG tablet, Take 0.5-2 tablets (5-20 mg total) by mouth 2 (two) times daily as needed (stress/anxiety symptoms)., Disp: 180 tablet, Rfl: 1   gabapentin  (NEURONTIN ) 100 MG capsule, Take 1 capsule (100 mg total) by mouth 3 (three) times daily., Disp: 270 capsule, Rfl: 1   glipiZIDE (GLUCOTROL XL) 5 MG 24 hr tablet, Take 5 mg by mouth daily with breakfast., Disp: , Rfl:    losartan-hydrochlorothiazide (HYZAAR) 50-12.5 MG tablet, Take 0.5 tablets by mouth daily., Disp: 45 tablet, Rfl: 0   metFORMIN  (GLUCOPHAGE -XR) 500 MG 24 hr tablet, Take 500 mg by mouth 2 (two) times daily with a meal., Disp: , Rfl:    Semaglutide,0.25 or 0.5MG /DOS, (OZEMPIC, 0.25 OR 0.5 MG/DOSE,) 2 MG/3ML SOPN, Inject 0.25 mg into the skin once a week for 28 days, THEN 0.5 mg once a week for 28 days., Disp: 3 mL, Rfl: 0  Patient Active Problem List   Diagnosis Date Noted   Bipolar 2 disorder (HCC) 03/07/2024   Family history of SIDS (sudden infant  death syndrome) 12/19/2019   Smoker 09/23/2019   History of gestational hypertension 09/23/2019   History of gestational diabetes 09/23/2019   History of pre-eclampsia 09/23/2019   Type 2 diabetes mellitus with hyperglycemia, without long-term current use of insulin  (HCC) 11/16/2018    Past Surgical History:  Procedure Laterality Date   COLPOSCOPY     HEMORRHOID SURGERY N/A 10/26/2019   Procedure: HEMORRHOIDECTOMY;  Surgeon: Dessa Reyes ORN, MD;  Location: [facility] ORS;  Service: General;  Laterality: N/A;   TUBAL LIGATION Bilateral 12/02/2019   Procedure: POST PARTUM TUBAL LIGATION;  Surgeon: Janit Alm Agent, MD;  Location: [facility] ORS;  Service: Gynecology;  Laterality: Bilateral;    Family History  Problem Relation Age of Onset   Cancer Mother    Diabetes Mother    Thyroid disease Mother    Obesity Mother    Bipolar disorder Mother    Bipolar disorder Sister    Early death Daughter     Ovarian cancer Paternal Grandmother    Diabetes Paternal Grandmother    Cancer Paternal Grandmother    COPD Paternal Grandmother    Obesity Paternal Grandmother     Social History   Socioeconomic History   Marital status: Married    Spouse name: Josh   Number of children: Not on file   Years of education: Not on file   Highest education level: 12th grade  Occupational History   Not on file  Tobacco Use   Smoking status: Every Day    Current packs/day: 0.50    Average packs/day: 0.5 packs/day for 10.0 years (5.0 ttl pk-yrs)    Types: Cigarettes   Smokeless tobacco: Never  Vaping Use   Vaping status: Never Used  Substance and Sexual Activity   Alcohol use: Not Currently    Comment: Haven't had alcohol in three years   Drug use: Never   Sexual activity: Yes    Birth control/protection: Surgical    Comment: Tubal  Other Topics Concern   Not on file  Social History Narrative   Married Josh 4 kids, moved back to [place] Aug 2024   Social Drivers of Health   Financial Resource Strain: Medium Risk (02/29/2024)   Overall Financial Resource Strain (CARDIA)    Difficulty of Paying Living Expenses: Somewhat hard  Food Insecurity: Food Insecurity Present (02/29/2024)   Hunger Vital Sign    Worried About Running Out of Food in the Last Year: Sometimes true    Ran Out of Food in the Last Year: Never true  Transportation Needs: No Transportation Needs (02/29/2024)   PRAPARE - Administrator, Civil Service (Medical): No    Lack of Transportation (Non-Medical): No  Physical Activity: Insufficiently Active (02/29/2024)   Exercise Vital Sign    Days of Exercise per Week: 1 day    Minutes of Exercise per Session: 30 min  Stress: Stress Concern Present (02/29/2024)   [employer] of Occupational Health - Occupational Stress Questionnaire    Feeling of Stress: Very much  Social Connections: Moderately Isolated (02/29/2024)   Social Connection and Isolation Panel     Frequency of Communication with Friends and Family: Twice a week    Frequency of Social Gatherings with Friends and Family: Once a week    Attends Religious Services: Never    Database administrator or Organizations: No    Attends Engineer, structural: Not on file    Marital Status: Married       No Known Allergies  Health Maintenance  Topic Date Due   FOOT EXAM  Never done   Hepatitis C Screening  Never done   Pneumococcal Vaccine 71-26 Years old (1 of 2 - PCV) Never done   Diabetic kidney evaluation - Urine ACR  11/16/2019   Cervical Cancer Screening (HPV/Pap Cotest)  07/02/2023   OPHTHALMOLOGY EXAM  03/07/2024 (Originally 07/02/2003)   COVID-19 Vaccine (3 - 2024-25 season) 03/20/2024 (Originally 05/17/2023)   HPV VACCINES (1 - 3-dose SCDM series) 03/04/2025 (Originally 07/01/2020)   INFLUENZA VACCINE  04/15/2024   HEMOGLOBIN A1C  08/18/2024   Diabetic kidney evaluation - eGFR measurement  02/16/2025   DTaP/Tdap/Td (3 - Td or Tdap) 10/09/2029   HIV Screening  Completed   Meningococcal B Vaccine  Aged Out    Chart Review Today: I personally reviewed active problem list, medication list, allergies, family history, social history, health maintenance, notes from last encounter, lab results, imaging with the patient/caregiver today.   Review of Systems  Constitutional: Negative.   HENT: Negative.    Eyes: Negative.   Respiratory: Negative.    Cardiovascular: Negative.   Gastrointestinal: Negative.   Endocrine: Negative.   Genitourinary: Negative.   Musculoskeletal: Negative.   Skin: Negative.   Allergic/Immunologic: Negative.   Neurological: Negative.   Hematological: Negative.   Psychiatric/Behavioral: Negative.    All other systems reviewed and are negative.    Objective:   Vitals:   03/07/24 0917 03/07/24 0946  BP: 136/78   Pulse: (!) 104 96  Resp: 16   SpO2: 97%   Weight: 188 lb (85.3 kg)   Height: 5' 6 (1.676 m)     Body mass index is 30.34  kg/m.  Physical Exam Vitals and nursing note reviewed.  Constitutional:      General: She is not in acute distress.    Appearance: Normal appearance. She is well-developed, well-groomed and overweight. She is not ill-appearing, toxic-appearing or diaphoretic.  HENT:     Head: Normocephalic and atraumatic.     Right Ear: External ear normal.     Left Ear: External ear normal.     Nose: Nose normal.   Eyes:     General: No scleral icterus.       Right eye: No discharge.        Left eye: No discharge.     Conjunctiva/sclera: Conjunctivae normal.   Neck:     Trachea: No tracheal deviation.   Cardiovascular:     Rate and Rhythm: Normal rate.     Pulses: Normal pulses.     Heart sounds: Normal heart sounds.  Pulmonary:     Effort: Pulmonary effort is normal. No respiratory distress.     Breath sounds: No stridor.   Skin:    General: Skin is warm and dry.     Findings: No rash.   Neurological:     Mental Status: She is alert.     Motor: No abnormal muscle tone.     Coordination: Coordination normal.     Gait: Gait normal.   Psychiatric:        Attention and Perception: Attention normal.        Mood and Affect: Mood normal. Mood is not anxious or depressed. Affect is tearful.        Speech: Speech normal.        Behavior: Behavior normal. Behavior is cooperative.        Thought Content: Thought content does not include homicidal or suicidal ideation. Thought content does not include  homicidal or suicidal plan.        Cognition and Memory: Cognition and memory normal.        Judgment: Judgment normal.      Functional Status Survey: Is the patient deaf or have difficulty hearing?: No Does the patient have difficulty seeing, even when wearing glasses/contacts?: No Does the patient have difficulty concentrating, remembering, or making decisions?: No Does the patient have difficulty walking or climbing stairs?: No Does the patient have difficulty dressing or bathing?:  No Does the patient have difficulty doing errands alone such as visiting a doctor's office or shopping?: No Results for orders placed or performed during the hospital encounter of 11/11/23  Basic metabolic panel   Collection Time: 11/11/23 12:06 PM  Result Value Ref Range   Sodium 135 135 - 145 mmol/L   Potassium 3.7 3.5 - 5.1 mmol/L   Chloride 104 98 - 111 mmol/L   CO2 20 (L) 22 - 32 mmol/L   Glucose, Bld 413 (H) 70 - 99 mg/dL   BUN 14 6 - 20 mg/dL   Creatinine, Ser 9.32 0.44 - 1.00 mg/dL   Calcium  9.3 8.9 - 10.3 mg/dL   GFR, Estimated >39 >39 mL/min   Anion gap 11 5 - 15  CBC   Collection Time: 11/11/23 12:06 PM  Result Value Ref Range   WBC 13.2 (H) 4.0 - 10.5 K/uL   RBC 6.33 (H) 3.87 - 5.11 MIL/uL   Hemoglobin 17.6 (H) 12.0 - 15.0 g/dL   HCT 49.0 (H) 63.9 - 53.9 %   MCV 80.4 80.0 - 100.0 fL   MCH 27.8 26.0 - 34.0 pg   MCHC 34.6 30.0 - 36.0 g/dL   RDW 86.9 88.4 - 84.4 %   Platelets 345 150 - 400 K/uL   nRBC 0.0 0.0 - 0.2 %  Troponin I (High Sensitivity)   Collection Time: 11/11/23 12:06 PM  Result Value Ref Range   Troponin I (High Sensitivity) 4 <18 ng/L      Assessment & Plan:   Problem List Items Addressed This Visit     Type 2 diabetes mellitus with hyperglycemia, without long-term current use of insulin  (HCC)   A1c 12.7 on 02/17/2024 per labs on pts phone [facility] wanted her to stay off lantus and restart oral meds and work on diet I suspect she will need basal insulin  for a while while starting GLP since fasting glucose was >300 and A1c is 12.7 Discussed possible ways to approach managing - its unclear if she will manage primarily with me as PCP or continue with [facility]? She has dexcom meter and new meds per [facility] - just started glipizide and metformin  Discussed GLP-1's no hx of MTC, pancreatitis or MEN type 2 syndrome Will be requesting lab records and abstracting to chart Seems like he is having sx from  hyperglycemia and uncontrolled DM including hunger/thirst polyuria, neuropathy, blurred vision Rx for low dose gabapentin  Will do DM foot exam with f/up visit      Relevant Medications   glipiZIDE (GLUCOTROL XL) 5 MG 24 hr tablet   metFORMIN  (GLUCOPHAGE -XR) 500 MG 24 hr tablet   Semaglutide,0.25 or 0.5MG /DOS, (OZEMPIC, 0.25 OR 0.5 MG/DOSE,) 2 MG/3ML SOPN   losartan-hydrochlorothiazide (HYZAAR) 50-12.5 MG tablet   Smoker   She is working on quitting and cutting back gradually      Bipolar 2 disorder (HCC)   She does not know exact dx, but it was recently diagnosed  with psych clinic in [place]  we will request records She does report wide fluctuations in her moods including short periods of better moods then she states she becomes very angry and agitated then will crash into deep depressive episodes She has used BuSpar and then for only short periods of time tried Jordan but she states that it did not help her moods overall and it seemed to make her more angry We will get her established with psychiatry for assessment and medical management Family hx of bipolar - her mom and sister She briefly had ETOH use/abuse when self medicating after the death of her child She also had SI/OD and then went to outpt psych in [place]  It does not sound like she ever was committed or did any inpt psych She currently is with husband and 3 children, well supported, no SI, HI, AVH but still having depressive sx moderate and severe anxiety sx       Other Visit Diagnoses       Encounter for medical examination to establish care    -  Primary   request records from GYN/PCP and psychiatry     Screening for viral disease       will do HIV/Hep C with next labs if needed after chart/records review from GYN/pcp and psych     Neuropathy       likely diabetic neuropathy, she does not do well with TID meds but gabapentin  did help her sx some   Relevant Medications   gabapentin  (NEURONTIN ) 100 MG capsule      Primary hypertension       on metoprolol and HCTZ in the past, with uncontrolled DM and tubal will get her on ARB/ACEI with HCTZ and drop BB   Relevant Medications   losartan-hydrochlorothiazide (HYZAAR) 50-12.5 MG tablet     Anxiety       buspar does seem to help her sx some, refill meds and f/up with psychiatry   Relevant Medications   busPIRone (BUSPAR) 10 MG tablet   Other Relevant Orders   Ambulatory referral to Psychiatry     Grief reaction with prolonged bereavement       death of her newborn, for a year she believed she was responsible for infants death ([name] born 2017-12-29 SVD,  possible SIDS death 16-Jan-2020)   Relevant Orders   Ambulatory referral to Psychiatry       Will get records and labs abstracted Close f/up on sugars/dexcom  Psych referral discussed with pt and clinic contact info given - pt was instructed to call themselves directly to inquire about new pt appt and if they take insurance - if they don't we will do another referral to [facility]   She did not do well with latuda but its unclear how long she was on it Could try vraylar? Caplyta?  If there is a delay getting into psych   Return for 1 month f/up dexcom/sugars.   Michelene Cower, PA-C 03/07/24 9:43 AM

## 2024-03-09 ENCOUNTER — Telehealth: Payer: Self-pay | Admitting: Pharmacy Technician

## 2024-03-09 ENCOUNTER — Other Ambulatory Visit (HOSPITAL_COMMUNITY): Payer: Self-pay

## 2024-03-09 NOTE — Telephone Encounter (Signed)
 Pharmacy Patient Advocate Encounter   Received notification from CoverMyMeds that prior authorization for Ozempic (0.25 or 0.5 MG/DOSE) 2MG /3ML pen-injectors is required/requested.   Insurance verification completed.   The patient is insured through [facility] .   Per test claim: PA required; PA submitted to above mentioned insurance via Fax Key/confirmation #/EOC [id number] Status is pending

## 2024-03-10 ENCOUNTER — Other Ambulatory Visit (HOSPITAL_COMMUNITY): Payer: Self-pay

## 2024-03-11 ENCOUNTER — Other Ambulatory Visit: Payer: Self-pay

## 2024-03-11 ENCOUNTER — Emergency Department
Admission: EM | Admit: 2024-03-11 | Discharge: 2024-03-11 | Disposition: A | Discharge status: Home / Self Care | Attending: Emergency Medicine | Admitting: Emergency Medicine

## 2024-03-11 ENCOUNTER — Other Ambulatory Visit (HOSPITAL_COMMUNITY): Payer: Self-pay

## 2024-03-11 ENCOUNTER — Emergency Department

## 2024-03-11 DIAGNOSIS — E119 Type 2 diabetes mellitus without complications: Secondary | ICD-10-CM | POA: Diagnosis not present

## 2024-03-11 DIAGNOSIS — Y92410 Unspecified street and highway as the place of occurrence of the external cause: Secondary | ICD-10-CM | POA: Diagnosis not present

## 2024-03-11 DIAGNOSIS — S59912A Unspecified injury of left forearm, initial encounter: Secondary | ICD-10-CM | POA: Diagnosis present

## 2024-03-11 DIAGNOSIS — I10 Essential (primary) hypertension: Secondary | ICD-10-CM | POA: Insufficient documentation

## 2024-03-11 DIAGNOSIS — S50812A Abrasion of left forearm, initial encounter: Secondary | ICD-10-CM | POA: Diagnosis not present

## 2024-03-11 DIAGNOSIS — M79602 Pain in left arm: Secondary | ICD-10-CM

## 2024-03-11 NOTE — ED Notes (Signed)
 Pt states left arm pain and denies any loc.
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
## 2024-03-17 ENCOUNTER — Other Ambulatory Visit (HOSPITAL_COMMUNITY): Payer: Self-pay

## 2024-03-17 NOTE — Telephone Encounter (Signed)
 FYI, just got off phone with ins and had to redo the PA it's still pending

## 2024-03-21 ENCOUNTER — Other Ambulatory Visit (HOSPITAL_COMMUNITY): Payer: Self-pay

## 2024-03-21 NOTE — Telephone Encounter (Signed)
 Pharmacy Patient Advocate Encounter  Received notification from [employer] that Prior Authorization for Ozempic  (0.25 or 0.5 MG/DOSE) 2MG /3ML pen-injectors  has been APPROVED and the approval letter will be uploaded to patient's media tab as soon as it is available.  [facility] filled the medication 03/17/2024.   PA #/Case ID/Reference #: Janice Kaufman

## 2024-04-05 ENCOUNTER — Ambulatory Visit: Admitting: Family Medicine

## 2024-04-13 ENCOUNTER — Other Ambulatory Visit: Payer: Self-pay | Admitting: Medical Genetics

## 2024-04-14 ENCOUNTER — Other Ambulatory Visit
Admission: RE | Admit: 2024-04-14 | Discharge: 2024-04-14 | Disposition: A | Discharge status: Home / Self Care | Payer: Self-pay | Source: Ambulatory Visit | Attending: Medical Genetics | Admitting: Medical Genetics

## 2024-04-26 LAB — GENECONNECT MOLECULAR SCREEN: Genetic Analysis Overall Interpretation: NEGATIVE

## 2024-04-27 ENCOUNTER — Encounter: Payer: Self-pay | Admitting: Family Medicine

## 2024-04-27 ENCOUNTER — Ambulatory Visit: Admitting: Family Medicine

## 2024-04-27 VITALS — BP 126/82 | HR 91 | Resp 16 | Ht 66.0 in | Wt 186.0 lb

## 2024-04-27 DIAGNOSIS — N644 Mastodynia: Secondary | ICD-10-CM

## 2024-04-27 DIAGNOSIS — F17200 Nicotine dependence, unspecified, uncomplicated: Secondary | ICD-10-CM

## 2024-04-27 DIAGNOSIS — E1165 Type 2 diabetes mellitus with hyperglycemia: Secondary | ICD-10-CM

## 2024-04-27 DIAGNOSIS — Z5181 Encounter for therapeutic drug level monitoring: Secondary | ICD-10-CM

## 2024-04-27 DIAGNOSIS — L989 Disorder of the skin and subcutaneous tissue, unspecified: Secondary | ICD-10-CM

## 2024-04-27 DIAGNOSIS — Z205 Contact with and (suspected) exposure to viral hepatitis: Secondary | ICD-10-CM | POA: Diagnosis not present

## 2024-04-27 DIAGNOSIS — I10 Essential (primary) hypertension: Secondary | ICD-10-CM

## 2024-04-27 DIAGNOSIS — R2232 Localized swelling, mass and lump, left upper limb: Secondary | ICD-10-CM

## 2024-04-27 DIAGNOSIS — R748 Abnormal levels of other serum enzymes: Secondary | ICD-10-CM

## 2024-04-27 LAB — POCT GLYCOSYLATED HEMOGLOBIN (HGB A1C): Hemoglobin A1C: 10.2 % — AB (ref 4.0–5.6)

## 2024-04-27 MED ORDER — OZEMPIC (0.25 OR 0.5 MG/DOSE) 2 MG/3ML ~~LOC~~ SOPN: 0.2500 mg | PEN_INJECTOR | SUBCUTANEOUS | Status: DC

## 2024-04-27 NOTE — Progress Notes (Signed)
 Name: Janice Kaufman   MRN: [id number]    DOB: 1991/05/23   Date:04/27/2024       Progress Note  Chief Complaint  Patient presents with   Medical Management of Chronic Issues   Diabetes   Anxiety     Subjective:   Janice Kaufman is a 33 y.o. female, presents to clinic for routine follow up on chronic conditions after recently est care here  Psych hx and suspected bipolar referred to psych but she hasn't gotten an appt yet.  DM - ozempic  was approved but too expensive , she does have a virtual diabetes specialist also managing her and adjusting medications. Lab Results  Component Value Date   HGBA1C 12.7 02/17/2024   Discussed the use of AI scribe software for clinical note transcription with the patient, who gave verbal consent to proceed.  History of Present Illness Janice Kaufman is a 33 year old female with diabetes who presents for diabetes management and concerns about breast pain and a lump in her armpit.  Glycemic control and antihyperglycemic therapy - Erratic blood glucose levels with recent averages suggesting an A1c of 8-9, improved from a previous 12.7 - Uses Dexcom device for continuous glucose monitoring - Currently taking glipizide and metformin  - Has not started a new medication previously due to cost concerns - Expresses concern about potential hypoglycemia with glipizide - Started Actos today (first dose) from other provider - Occasionally forgets to take medications  Breast pain and axillary mass - Intermittent breast pain associated with positional changes - Palpable lump in the left axilla present for several weeks - Concerned about the etiology of the axillary lump  Chronic cutaneous lesions - Chronic skin lesions present for 2-3 years - Lesions are painful, sometimes scabby, and occasionally exude pus - Lesions started to pop up all at once and then spread over several months and have been persistent since, some started with itch or injury,  the longstanding lesions are now painful when impacted/bumped - Trial of steroid creams without improvement from prior provider (OBGYN)  - Evaluated by a wound specialist without definitive diagnosis, no bx or derm consult   Antihypertensive therapy adherence - Currently on a half dose of blood pressure medication       Current Outpatient Medications:    busPIRone  (BUSPAR ) 10 MG tablet, Take 0.5-2 tablets (5-20 mg total) by mouth 2 (two) times daily as needed (stress/anxiety symptoms)., Disp: 180 tablet, Rfl: 1   gabapentin  (NEURONTIN ) 100 MG capsule, Take 1 capsule (100 mg total) by mouth 3 (three) times daily., Disp: 270 capsule, Rfl: 1   glipiZIDE (GLUCOTROL XL) 5 MG 24 hr tablet, Take 5 mg by mouth daily with breakfast., Disp: , Rfl:    losartan -hydrochlorothiazide (HYZAAR) 50-12.5 MG tablet, Take 0.5 tablets by mouth daily., Disp: 45 tablet, Rfl: 0   metFORMIN  (GLUCOPHAGE -XR) 500 MG 24 hr tablet, Take 500 mg by mouth 2 (two) times daily with a meal., Disp: , Rfl:    Semaglutide ,0.25 or 0.5MG /DOS, (OZEMPIC , 0.25 OR 0.5 MG/DOSE,) 2 MG/3ML SOPN, Inject 0.25 mg into the skin once a week for 28 days, THEN 0.5 mg once a week for 28 days., Disp: 3 mL, Rfl: 0  Patient Active Problem List   Diagnosis Date Noted   Bipolar 2 disorder (HCC) 03/07/2024   Primary hypertension 03/07/2024   Neuropathy 03/07/2024   Anxiety 03/07/2024   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   Class 1 obesity with serious comorbidity and body mass index (BMI) of 30.0  to 30.9 in adult 03/07/2024   Family history of SIDS (sudden infant death syndrome) 01/01/20   Smoker 09/23/2019   History of gestational hypertension 09/23/2019   History of gestational diabetes 09/23/2019   History of pre-eclampsia 09/23/2019   Type 2 diabetes mellitus with hyperglycemia, without long-term current use of insulin  (HCC) 11/16/2018    Past Surgical History:  Procedure Laterality Date   COLPOSCOPY     HEMORRHOID  SURGERY N/A 10/26/2019   Procedure: HEMORRHOIDECTOMY;  Surgeon: Dessa Reyes ORN, MD;  Location: [facility] ORS;  Service: General;  Laterality: N/A;   TUBAL LIGATION Bilateral 12/02/2019   Procedure: POST PARTUM TUBAL LIGATION;  Surgeon: Janit Alm Agent, MD;  Location: [facility] OR[facility];  Service: Gynecology;  Laterality: Bilateral;    Family History  Problem Relation Age of Onset   Cancer Mother    Diabetes Mother    Thyroid disease Mother    Obesity Mother    Bipolar disorder Mother    Bipolar disorder Sister    Early death Daughter    Ovarian cancer Paternal Grandmother    Diabetes Paternal Grandmother    Cancer Paternal Grandmother    COPD Paternal Grandmother    Obesity Paternal Grandmother     Social History   Tobacco Use   Smoking status: Every Day    Current packs/day: 0.50    Average packs/day: 0.5 packs/day for 10.0 years (5.0 ttl pk-yrs)    Types: Cigarettes   Smokeless tobacco: Never  Vaping Use   Vaping status: Never Used  Substance Use Topics   Alcohol use: Not Currently    Comment: Haven't had alcohol in three years   Drug use: Never     No Known Allergies  Health Maintenance  Topic Date Due   FOOT EXAM  Never done   Diabetic kidney evaluation - Urine ACR  Never done   Hepatitis C Screening  Never done   Cervical Cancer Screening (HPV/Pap Cotest)  07/02/2023   OPHTHALMOLOGY EXAM  04/27/2024 (Originally 07/02/2003)   COVID-19 Vaccine (3 - 2024-25 season) 05/13/2024 (Originally 05/17/2023)   INFLUENZA VACCINE  12/13/2024 (Originally 04/15/2024)   HPV VACCINES (1 - 3-dose SCDM series) 03/04/2025 (Originally 07/01/2020)   Pneumococcal Vaccine: 19-49 Years (1 of 2 - PCV) 04/27/2025 (Originally 07/01/2012)   Hepatitis B Vaccines (1 of 3 - 19+ 3-dose series) 04/27/2025 (Originally 07/01/2012)   HEMOGLOBIN A1C  08/18/2024   Diabetic kidney evaluation - eGFR measurement  02/16/2025   DTaP/Tdap/Td (3 - Td or Tdap) 10/09/2029   HIV Screening  Completed   Meningococcal B  Vaccine  Aged Out    Chart Review Today: I personally reviewed active problem list, medication list, allergies, family history, social history, health maintenance, notes from last encounter, lab results, imaging with the patient/caregiver today.   Review of Systems  Constitutional: Negative.   HENT: Negative.    Eyes: Negative.   Respiratory: Negative.    Cardiovascular: Negative.   Gastrointestinal: Negative.   Endocrine: Negative.   Genitourinary: Negative.   Musculoskeletal: Negative.   Skin: Negative.   Allergic/Immunologic: Negative.   Neurological: Negative.   Hematological: Negative.   Psychiatric/Behavioral: Negative.    All other systems reviewed and are negative.    Objective:   Vitals:   04/27/24 1519  BP: 126/82  Pulse: 91  Resp: 16  SpO2: 97%  Weight: 186 lb (84.4 kg)  Height: 5' 6 (1.676 m)    Body mass index is 30.02 kg/m.  Physical Exam Vitals and nursing note reviewed.  Constitutional:      General: She is not in acute distress.    Appearance: Normal appearance. She is well-developed, well-groomed and overweight. She is not ill-appearing, toxic-appearing or diaphoretic.  HENT:     Head: Normocephalic and atraumatic.     Right Ear: External ear normal.     Left Ear: External ear normal.     Nose: Nose normal.  Eyes:     General: No scleral icterus.       Right eye: No discharge.        Left eye: No discharge.     Conjunctiva/sclera: Conjunctivae normal.  Neck:     Trachea: No tracheal deviation.  Cardiovascular:     Rate and Rhythm: Normal rate.  Pulmonary:     Effort: Pulmonary effort is normal. No respiratory distress.     Breath sounds: No stridor.  Lymphadenopathy:     Upper Body:     Left upper body: No supraclavicular, axillary or pectoral adenopathy.     Comments: Left axilla superficial papule non-mobile, minimally tender  Skin:    General: Skin is warm and dry.     Findings: Lesion present. No rash.  Neurological:      Mental Status: She is alert.     Motor: No abnormal muscle tone.     Coordination: Coordination normal.     Gait: Gait normal.  Psychiatric:        Mood and Affect: Mood normal.        Behavior: Behavior normal. Behavior is cooperative.     DM foot exam done today             Assessment & Plan:   Assessment & Plan  Type 2 diabetes mellitus with hyperglycemia, without long-term current use of insulin  (HCC) Assessment & Plan: Type 2 diabetes mellitus with hyperglycemia, uncontrolled, newly diagnosed with associated neuropathy Blood glucose improved from A1c 12.7% to 8-9% per pts dexcom averages - labs here today to f/up Lab Results  Component Value Date   HGBA1C 10.2 (A) 04/27/2024    Current medications include glipizide, metformin , semaglutide , and recently other provider added Actos. Discussed hypoglycemia risk with glipizide and semaglutide 's monitor with CGM and reviewed plan for low sugar readings She may choose to simplify management to avoid med SE/hypoglycemia and slowly adjust remaining med doses, overall improving  Gabapentin  used for neuropathy with low dose to minimize side effects. - Continue glipizide, metformin , and semaglutide  as prescribed. - Monitor blood glucose levels closely, especially for hypoglycemia. - Adjust glipizide dosage if low blood sugar episodes occur. - Continue gabapentin  for neuropathy as needed, up to three times daily. - Provide samples of semaglutide  if insurance issues persist. - Consult pharmacist for cost assistance with medications.  Orders: -     POCT glycosylated hemoglobin (Hb A1C) -     Comprehensive metabolic panel with GFR -     CBC with Differential/Platelet -     Ozempic  (0.25 or 0.5 MG/DOSE); Inject 0.25 mg into the skin once a week.  Primary hypertension Assessment & Plan: Med change with dm dx and s/p tubal ligation, she started 1/2 tab of losartan -HCTZ 50-12.5 and BP is at goal today, she is tolerating current  dose BP Readings from Last 3 Encounters:  04/27/24 126/82  03/11/24 (!) 156/111  03/07/24 136/78      Smoker - smoking cessation encouraged resources offered -     CBC with Differential/Platelet  Exposure to hepatitis C - spouse recently diagnosed so pt would  like to get tested Contact with and suspected exposure to viral hepatitis Discussed transmission risks, screening necessity, and treatability of hepatitis C. - Order hepatitis C antibody test. - Recheck liver enzymes, including alkaline phosphatase, AST, ALT, and bilirubin. -     Hepatitis C antibody  Encounter for medication monitoring -     Comprehensive metabolic panel with GFR -     CBC with Differential/Platelet -     Hepatitis C antibody  Elevated alkaline phosphatase measurement Abnormal liver enzymes Previous elevated alkaline phosphatase with normal AST, ALT, and bilirubin. Further evaluation needed with hepatitis C screening. - Recheck liver enzymes with hepatitis C screening. -     Comprehensive metabolic panel with GFR  Skin lesions See photos - Chronic skin lesions of unclear etiology Onset suddenly a few years ago, concentrated to feet/LE and some smaller areas to UE Chronic skin lesions with soreness, scabbing, and pus. Previous steroid creams ineffective form pt past OBYGN.  dermatology for evaluation -     Ambulatory referral to Dermatology  Mass of axilla, left -     MM 3D DIAGNOSTIC MAMMOGRAM BILATERAL BREAST -     US  LIMITED ULTRASOUND INCLUDING AXILLA LEFT BREAST   Breast pain, left -     MM 3D DIAGNOSTIC MAMMOGRAM BILATERAL BREAST -     US  LIMITED ULTRASOUND INCLUDING AXILLA LEFT BREAST  left breast and axillary masses/nodules with pain Intermittent left breast pain and palpable axillary lump for a month. Differential includes ingrown hairs, cysts, absesses - Order breast and axillary ultrasound. - Contact breast center for appropriate imaging based on age.    Recording duration: 26  minutes       Return in about 3 months (around 07/28/2024) for Routine follow-up DM ozempic  .   Michelene Cower, PA-C 04/27/24 3:19 PM

## 2024-04-27 NOTE — Patient Instructions (Signed)
 [employer], [place].   P: [phone] F: [phone]

## 2024-04-28 LAB — CBC WITH DIFFERENTIAL/PLATELET
Absolute Lymphocytes: 4104 {cells}/uL — ABNORMAL HIGH (ref 850–3900)
Absolute Monocytes: 824 {cells}/uL (ref 200–950)
Basophils Absolute: 99 {cells}/uL (ref 0–200)
Basophils Relative: 0.7 %
Eosinophils Absolute: 554 {cells}/uL — ABNORMAL HIGH (ref 15–500)
Eosinophils Relative: 3.9 %
HCT: 50.5 % — ABNORMAL HIGH (ref 35.0–45.0)
Hemoglobin: 16.9 g/dL — ABNORMAL HIGH (ref 11.7–15.5)
MCH: 28.7 pg (ref 27.0–33.0)
MCHC: 33.5 g/dL (ref 32.0–36.0)
MCV: 85.7 fL (ref 80.0–100.0)
MPV: 8.6 fL (ref 7.5–12.5)
Monocytes Relative: 5.8 %
Neutro Abs: 8619 {cells}/uL — ABNORMAL HIGH (ref 1500–7800)
Neutrophils Relative %: 60.7 %
Platelets: 401 Thousand/uL — ABNORMAL HIGH (ref 140–400)
RBC: 5.89 Million/uL — ABNORMAL HIGH (ref 3.80–5.10)
RDW: 13 % (ref 11.0–15.0)
Total Lymphocyte: 28.9 %
WBC: 14.2 Thousand/uL — ABNORMAL HIGH (ref 3.8–10.8)

## 2024-04-28 LAB — COMPREHENSIVE METABOLIC PANEL WITH GFR
AG Ratio: 1.7 (calc) (ref 1.0–2.5)
ALT: 15 U/L (ref 6–29)
AST: 11 U/L (ref 10–30)
Albumin: 4.3 g/dL (ref 3.6–5.1)
Alkaline phosphatase (APISO): 95 U/L (ref 31–125)
BUN: 16 mg/dL (ref 7–25)
CO2: 23 mmol/L (ref 20–32)
Calcium: 9.5 mg/dL (ref 8.6–10.2)
Chloride: 103 mmol/L (ref 98–110)
Creat: 0.61 mg/dL (ref 0.50–0.97)
Globulin: 2.5 g/dL (ref 1.9–3.7)
Glucose, Bld: 223 mg/dL — ABNORMAL HIGH (ref 65–99)
Potassium: 4.4 mmol/L (ref 3.5–5.3)
Sodium: 136 mmol/L (ref 135–146)
Total Bilirubin: 0.3 mg/dL (ref 0.2–1.2)
Total Protein: 6.8 g/dL (ref 6.1–8.1)
eGFR: 123 mL/min/1.73m2 (ref >=60)

## 2024-04-28 LAB — HEPATITIS C ANTIBODY: Hepatitis C Ab: NONREACTIVE

## 2024-05-11 ENCOUNTER — Ambulatory Visit: Payer: Self-pay | Admitting: Family Medicine

## 2024-05-11 DIAGNOSIS — D582 Other hemoglobinopathies: Secondary | ICD-10-CM

## 2024-05-11 DIAGNOSIS — R748 Abnormal levels of other serum enzymes: Secondary | ICD-10-CM

## 2024-05-11 DIAGNOSIS — L989 Disorder of the skin and subcutaneous tissue, unspecified: Secondary | ICD-10-CM

## 2024-05-11 DIAGNOSIS — D75839 Thrombocytosis, unspecified: Secondary | ICD-10-CM

## 2024-05-11 DIAGNOSIS — D72829 Elevated white blood cell count, unspecified: Secondary | ICD-10-CM

## 2024-05-12 ENCOUNTER — Encounter: Payer: Self-pay | Admitting: Family Medicine

## 2024-05-12 NOTE — Assessment & Plan Note (Signed)
 Type 2 diabetes mellitus with hyperglycemia, uncontrolled, newly diagnosed with associated neuropathy Blood glucose improved from A1c 12.7% to 8-9% per pts dexcom averages - labs here today to f/up Lab Results  Component Value Date   HGBA1C 10.2 (A) 04/27/2024    Current medications include glipizide, metformin , semaglutide , and recently other provider added Actos. Discussed hypoglycemia risk with glipizide and semaglutide 's monitor with CGM and reviewed plan for low sugar readings She may choose to simplify management to avoid med SE/hypoglycemia and slowly adjust remaining med doses, overall improving  Gabapentin  used for neuropathy with low dose to minimize side effects. - Continue glipizide, metformin , and semaglutide  as prescribed. - Monitor blood glucose levels closely, especially for hypoglycemia. - Adjust glipizide dosage if low blood sugar episodes occur. - Continue gabapentin  for neuropathy as needed, up to three times daily. - Provide samples of semaglutide  if insurance issues persist. - Consult pharmacist for cost assistance with medications.

## 2024-05-12 NOTE — Assessment & Plan Note (Signed)
 Med change with dm dx and s/p tubal ligation, she started 1/2 tab of losartan -HCTZ 50-12.5 and BP is at goal today, she is tolerating current dose BP Readings from Last 3 Encounters:  04/27/24 126/82  03/11/24 (!) 156/111  03/07/24 136/78

## 2024-05-24 ENCOUNTER — Ambulatory Visit
Admission: RE | Admit: 2024-05-24 | Discharge: 2024-05-24 | Disposition: A | Discharge status: Home / Self Care | Source: Ambulatory Visit | Attending: Family Medicine | Admitting: Family Medicine

## 2024-05-24 ENCOUNTER — Ambulatory Visit
Admission: RE | Admit: 2024-05-24 | Discharge: 2024-05-24 | Disposition: A | Discharge status: Home / Self Care | Source: Ambulatory Visit | Attending: Family Medicine

## 2024-05-24 DIAGNOSIS — N644 Mastodynia: Secondary | ICD-10-CM | POA: Diagnosis present

## 2024-05-24 DIAGNOSIS — R2232 Localized swelling, mass and lump, left upper limb: Secondary | ICD-10-CM | POA: Insufficient documentation

## 2024-06-01 ENCOUNTER — Ambulatory Visit

## 2024-06-01 DIAGNOSIS — R21 Rash and other nonspecific skin eruption: Secondary | ICD-10-CM | POA: Diagnosis not present

## 2024-06-01 DIAGNOSIS — L921 Necrobiosis lipoidica, not elsewhere classified: Secondary | ICD-10-CM

## 2024-06-01 MED ORDER — VTAMA 1 % EX CREA: TOPICAL_CREAM | CUTANEOUS | 5 refills | Status: DC

## 2024-06-01 MED ORDER — DOXYCYCLINE MONOHYDRATE 100 MG PO TABS: 100.0000 mg | ORAL_TABLET | Freq: Two times a day (BID) | ORAL | 2 refills | Status: AC

## 2024-06-01 NOTE — Patient Instructions (Addendum)

## 2024-06-01 NOTE — Addendum Note (Signed)
 Addended by: RAYMUND DOMINO C on: 06/01/2024 01:06 PM   Modules accepted: Orders

## 2024-06-01 NOTE — Progress Notes (Addendum)
 Subjective   Janice Kaufman is a 33 y.o. female who presents for the following: Lesion(s) of concern . Patient is new patient  Today patient reports: Lesions at left forearm, feet present x 2-3 years, does have scabs that form and come off. Lesions at bilateral feet are painful. No personal or family hx of skin cancer. Patient reports at one point was prescribed a topical steroid with no help. Intermittently drain pus.   This patient is accompanied in the office by her spouse.   Review of Systems:    No other skin or systemic complaints except as noted in HPI or Assessment and Plan.  The following portions of the chart were reviewed this encounter and updated as appropriate: medications, allergies, medical history  Relevant Medical History:  n/a   Objective  Well appearing patient in no apparent distress; mood and affect are within normal limits. Examination was performed of the: Focused Exam of: Arms, legs, back, chest   Examination notable for: Right dorsal foot, left dorsal foot, left lower extremity, and left arm with pink/brown scaly somewhat atrophic plaques. Some with cribriform scarring.  Examination limited by: Undergarments, Clothing, and Patient deferred removal            Left forearm Right dorsal foot, left dorsal foot, left lower extremity, and left arm with pink scaly somewhat atrophic plaques.  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  - To help confirm diagnosis, biopsy(s) obtained today.  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)  - Discussed side effects and precautions with doxycycline  including taking with meal, waiting at least 30 minutes before lying down at night, increased sun sensitivity, and to stop medication if becomes pregnant or breastfeeding  Level of service outlined above   Procedures, orders, diagnosis for this visit:  RASH Left forearm Skin / nail biopsy - Left forearm Type of biopsy: punch   Informed consent: discussed and consent obtained   Timeout: patient name, date of birth, surgical site, and procedure verified   Procedure prep:  Patient was prepped and draped in usual sterile fashion Prep type:  Isopropyl alcohol Anesthesia: the lesion was anesthetized in a standard fashion   Anesthetic:  1% lidocaine  w/ epinephrine  1-100,000 buffered w/ 8.4% NaHCO3 Punch size:  4 mm Suture size:  4-0 Suture type: Prolene (polypropylene)   Suture removal (days):  7 Hemostasis achieved with: suture, pressure and aluminum chloride   Outcome: patient tolerated procedure well   Post-procedure details: sterile dressing applied and wound care instructions given   Dressing type: bandage and petrolatum    Specimen 1 - Surgical pathology Differential Diagnosis: psoriasis vs pyoderma vegetans vs NLD vs morphea vs atypical infection vs other   Check Margins: No Right dorsal foot, left dorsal foot, left lower extremity, and left arm with pink scaly somewhat atrophic plaques.Some with cribriform scarring.   Rash -     Skin / nail biopsy -  Surgical pathology; Standing  Other orders -     Doxycycline  Monohydrate; Take 1 tablet (100 mg total) by mouth 2 (two) times daily. Take 100 mg twice daily for 3 months  Dispense: 60 tablet; Refill: 2    Return to clinic: Return for 7-10 days ,  Suture Removal.  I, Jacquelynn V. Wilfred, CMA, am acting as scribe for Lauraine JAYSON Kanaris, MD .  Documentation: I have reviewed the above documentation for accuracy and completeness, and I agree with the above.  Lauraine JAYSON Kanaris, MD

## 2024-06-02 LAB — SURGICAL PATHOLOGY

## 2024-06-08 ENCOUNTER — Other Ambulatory Visit: Payer: Self-pay

## 2024-06-08 ENCOUNTER — Ambulatory Visit

## 2024-06-08 ENCOUNTER — Other Ambulatory Visit
Admission: RE | Admit: 2024-06-08 | Discharge: 2024-06-08 | Disposition: A | Discharge status: Home / Self Care | Attending: Family Medicine | Admitting: Family Medicine

## 2024-06-08 ENCOUNTER — Ambulatory Visit: Payer: Self-pay

## 2024-06-08 DIAGNOSIS — Z48817 Encounter for surgical aftercare following surgery on the skin and subcutaneous tissue: Secondary | ICD-10-CM

## 2024-06-08 DIAGNOSIS — L921 Necrobiosis lipoidica, not elsewhere classified: Secondary | ICD-10-CM

## 2024-06-08 DIAGNOSIS — D582 Other hemoglobinopathies: Secondary | ICD-10-CM | POA: Insufficient documentation

## 2024-06-08 DIAGNOSIS — D72829 Elevated white blood cell count, unspecified: Secondary | ICD-10-CM | POA: Diagnosis present

## 2024-06-08 DIAGNOSIS — L989 Disorder of the skin and subcutaneous tissue, unspecified: Secondary | ICD-10-CM

## 2024-06-08 DIAGNOSIS — D75839 Thrombocytosis, unspecified: Secondary | ICD-10-CM | POA: Insufficient documentation

## 2024-06-08 LAB — CBC WITH DIFFERENTIAL/PLATELET
Abs Immature Granulocytes: 0.14 K/uL — ABNORMAL HIGH (ref 0.00–0.07)
Basophils Absolute: 0.1 K/uL (ref 0.0–0.1)
Basophils Relative: 1 %
Eosinophils Absolute: 0.6 K/uL — ABNORMAL HIGH (ref 0.0–0.5)
Eosinophils Relative: 4 %
HCT: 45.8 % (ref 36.0–46.0)
Hemoglobin: 16 g/dL — ABNORMAL HIGH (ref 12.0–15.0)
Immature Granulocytes: 1 %
Lymphocytes Relative: 25 %
Lymphs Abs: 4.2 K/uL — ABNORMAL HIGH (ref 0.7–4.0)
MCH: 28.8 pg (ref 26.0–34.0)
MCHC: 34.9 g/dL (ref 30.0–36.0)
MCV: 82.5 fL (ref 80.0–100.0)
Monocytes Absolute: 1 K/uL (ref 0.1–1.0)
Monocytes Relative: 6 %
Neutro Abs: 11.1 K/uL — ABNORMAL HIGH (ref 1.7–7.7)
Neutrophils Relative %: 63 %
Platelets: 422 K/uL — ABNORMAL HIGH (ref 150–400)
RBC: 5.55 MIL/uL — ABNORMAL HIGH (ref 3.87–5.11)
RDW: 12.6 % (ref 11.5–15.5)
Smear Review: NORMAL
WBC: 17.1 K/uL — ABNORMAL HIGH (ref 4.0–10.5)
nRBC: 0 % (ref 0.0–0.2)
nRBC: 0 /100{WBCs}

## 2024-06-08 LAB — TECHNOLOGIST SMEAR REVIEW
Clinical Information: ELEVATED
Plt Morphology: NORMAL

## 2024-06-08 MED ORDER — TACROLIMUS 0.1 % EX OINT: TOPICAL_OINTMENT | CUTANEOUS | 5 refills | Status: AC

## 2024-06-08 NOTE — Progress Notes (Signed)
    Subjective   Janice Kaufman is a 33 y.o. female who presents for the following: Follow up of biopsy. 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 Patient is taking doxycycline  100 MG twice daily. Patient has been diabetic since 2019, followed by Michelene Cower, NP.   This patient is accompanied in the office by her spouse.  Review of Systems:    No other skin or systemic complaints except as noted in HPI or Assessment and Plan.  The following portions of the chart were reviewed this encounter and updated as appropriate: medications, allergies, medical history  Relevant Medical History:  n/a   Objective  Well appearing patient in no apparent distress; mood and affect are within normal limits. Examination was performed of the: Focused Exam of: arms, legs   Examination notable for: RASHES  Examination limited by: Clothing  - atrophic, brown/orange, cribriform plaques     Assessment & Plan   Necrobiosis lipoidica  Chronic and persistent condition with duration or expected duration over one year. Condition is symptomatic and bothersome to patient. Patient is flaring and not currently at treatment goal.  - Bx proven 06/02/23  - Discussed this is a rare, granulomatous inflammatory skin disease of unknown origin, sometimes associated with diabetes mellitus  - Lesions usually develop on the lower extremities and progress toward ulceration and scarring  - Many treatments have been proposed including topical steroids, calcineurin inhibitors, tapinarof , systemic immunosuppression though none have demonstrated consistent efficacy, and there are no standard regimens to date  - Discussed referral to endocrinologist for diabetes management. Patient will discuss with PCP.  - Patient does have persistent abnormalities on CBC that she is following with PCP for. 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  Advised discussed with path who saw no features of this on her biopsy so likely CBC abnormalities are unrelated to her skin condition. Discussed if persistent abnormalities agree with eval by Heme Onc - pt to discuss w PCP  - Start tacrolimus  0.1% ointment twice daily.+ - Samples of Vtama  Cream given to apply twice daily.   Encounter for Removal of Sutures - Incision site at the left forearm is clean, dry and intact - Wound cleansed, sutures removed, wound cleansed and steri strips applied.  - Discussed pathology results showing Necrobiosis lipoidica   - Patient advised to keep steri-strips dry until they fall off. - Scars remodel for a full year. - Once steri-strips fall off, patient can apply over-the-counter silicone scar cream each night to help with scar remodeling if desired. - Patient advised to call with any concerns or if they notice any new or changing lesions.  Procedures, orders, diagnosis for this visit:  NECROBIOSIS LIPOIDICA   Related Medications tacrolimus  (PROTOPIC ) 0.1 % ointment Apply 2 grams twice daily to affected areas of skin  Necrobiosis lipoidica -     Tacrolimus ; Apply 2 grams twice daily to affected areas of skin  Dispense: 100 g; Refill: 5    Return to clinic: Return 4-6 months, for Necrobiosis lipoidica.  LILLETTE Andrea Kerns, CMA, am acting as scribe for Lauraine JAYSON Kanaris, MD .  Documentation: I have reviewed the above documentation for accuracy and completeness, and I agree with the above.  Lauraine JAYSON Kanaris, MD

## 2024-06-08 NOTE — Patient Instructions (Signed)

## 2024-06-09 ENCOUNTER — Ambulatory Visit: Payer: Self-pay | Admitting: Family Medicine

## 2024-06-09 ENCOUNTER — Other Ambulatory Visit: Payer: Self-pay

## 2024-06-09 DIAGNOSIS — D582 Other hemoglobinopathies: Secondary | ICD-10-CM

## 2024-06-09 DIAGNOSIS — D72829 Elevated white blood cell count, unspecified: Secondary | ICD-10-CM

## 2024-06-10 LAB — ANA,IFA RA DIAG PNL W/RFLX TIT/PATN
Anti Nuclear Antibody (ANA): NEGATIVE
Cyclic Citrullin Peptide Ab: <16 U
Rheumatoid fact SerPl-aCnc: <10 [IU]/mL (ref <14)

## 2024-06-13 ENCOUNTER — Other Ambulatory Visit: Payer: Self-pay | Admitting: Family Medicine

## 2024-06-13 DIAGNOSIS — F419 Anxiety disorder, unspecified: Secondary | ICD-10-CM

## 2024-06-14 ENCOUNTER — Ambulatory Visit: Payer: Self-pay | Admitting: Family Medicine

## 2024-06-14 NOTE — Telephone Encounter (Signed)
 Requested medications are due for refill today.  If pt is taking the highest dose, yes.  Requested medications are on the active medications list.  yes  Last refill. 03/07/2024 #180 1 rf  Future visit scheduled.   yes  Notes to clinic.  New medication to this pt.    Requested Prescriptions  Pending Prescriptions Disp Refills   busPIRone  (BUSPAR ) 10 MG tablet [Pharmacy Med Name: busPIRone  HCl 10 MG Oral Tablet] 180 tablet 0    Sig: TAKE ONE-HALF TO TWO TABLET(S) BY MOUTH TWICE DAILY AS NEEDED FOR STRESS/ ANXIETY SYMPTOMS.     Psychiatry: Anxiolytics/Hypnotics - Non-controlled Passed - 06/14/2024  5:25 PM      Passed - Valid encounter within last 12 months    Recent Outpatient Visits           1 month ago Type 2 diabetes mellitus with hyperglycemia, without long-term current use of insulin  [facility])   [facility] Leavy Mole, PA-C   3 months ago Encounter for medical examination to establish care   [facility] Leavy Mole, PA-C       Future Appointments             In 1 month Leavy Mole, PA-C [facility] [place]   In 4 months Raymund, Lauraine BROCKS, MD [facility]

## 2024-06-16 ENCOUNTER — Encounter: Payer: Self-pay | Admitting: Family Medicine

## 2024-06-21 ENCOUNTER — Inpatient Hospital Stay: Attending: Oncology | Admitting: Oncology

## 2024-06-21 ENCOUNTER — Inpatient Hospital Stay

## 2024-06-21 ENCOUNTER — Encounter: Payer: Self-pay | Admitting: Oncology

## 2024-06-21 VITALS — BP 137/92 | HR 95 | Temp 97.8°F | Resp 18 | Wt 188.0 lb

## 2024-06-21 DIAGNOSIS — F17210 Nicotine dependence, cigarettes, uncomplicated: Secondary | ICD-10-CM | POA: Diagnosis not present

## 2024-06-21 DIAGNOSIS — D72829 Elevated white blood cell count, unspecified: Secondary | ICD-10-CM

## 2024-06-21 DIAGNOSIS — D751 Secondary polycythemia: Secondary | ICD-10-CM | POA: Diagnosis present

## 2024-06-21 DIAGNOSIS — F17200 Nicotine dependence, unspecified, uncomplicated: Secondary | ICD-10-CM

## 2024-06-21 DIAGNOSIS — Z803 Family history of malignant neoplasm of breast: Secondary | ICD-10-CM | POA: Insufficient documentation

## 2024-06-21 DIAGNOSIS — Z139 Encounter for screening, unspecified: Secondary | ICD-10-CM

## 2024-06-21 DIAGNOSIS — Z8041 Family history of malignant neoplasm of ovary: Secondary | ICD-10-CM | POA: Diagnosis not present

## 2024-06-21 LAB — CBC WITH DIFFERENTIAL/PLATELET
Abs Immature Granulocytes: 0.17 K/uL — ABNORMAL HIGH (ref 0.00–0.07)
Basophils Absolute: 0.1 K/uL (ref 0.0–0.1)
Basophils Relative: 1 %
Eosinophils Absolute: 0.5 K/uL (ref 0.0–0.5)
Eosinophils Relative: 4 %
HCT: 49.1 % — ABNORMAL HIGH (ref 36.0–46.0)
Hemoglobin: 17.2 g/dL — ABNORMAL HIGH (ref 12.0–15.0)
Immature Granulocytes: 1 %
Lymphocytes Relative: 25 %
Lymphs Abs: 3.7 K/uL (ref 0.7–4.0)
MCH: 28.8 pg (ref 26.0–34.0)
MCHC: 35 g/dL (ref 30.0–36.0)
MCV: 82.1 fL (ref 80.0–100.0)
Monocytes Absolute: 0.8 K/uL (ref 0.1–1.0)
Monocytes Relative: 6 %
Neutro Abs: 9.4 K/uL — ABNORMAL HIGH (ref 1.7–7.7)
Neutrophils Relative %: 63 %
Platelets: 364 K/uL (ref 150–400)
RBC: 5.98 MIL/uL — ABNORMAL HIGH (ref 3.87–5.11)
RDW: 13.2 % (ref 11.5–15.5)
WBC: 14.7 K/uL — ABNORMAL HIGH (ref 4.0–10.5)
nRBC: 0 % (ref 0.0–0.2)

## 2024-06-21 LAB — HEPATITIS B SURFACE ANTIGEN: Hepatitis B Surface Ag: NONREACTIVE

## 2024-06-21 LAB — TECHNOLOGIST SMEAR REVIEW
Plt Morphology: NORMAL
RBC MORPHOLOGY: NORMAL
WBC MORPHOLOGY: NORMAL

## 2024-06-21 LAB — LACTATE DEHYDROGENASE: LDH: 102 U/L (ref 98–192)

## 2024-06-21 LAB — HEPATITIS B CORE ANTIBODY, IGM: Hep B C IgM: NONREACTIVE

## 2024-06-21 LAB — HIV ANTIBODY (ROUTINE TESTING W REFLEX): HIV Screen 4th Generation wRfx: NONREACTIVE

## 2024-06-21 NOTE — Assessment & Plan Note (Addendum)
 Recommend smoke cessation.

## 2024-06-21 NOTE — Progress Notes (Signed)
 Hematology/Oncology Consult note [phone] [phone]        REFERRING PROVIDER: Leavy Mole, PA-C   CHIEF COMPLAINTS/REASON FOR VISIT:  Evaluation of erythrocytosis and leukocytosis.   ASSESSMENT & PLAN:   Erythrocytosis Labs are reviewed and discussed with patient.  Likely secondary to smoking Erythrocytosis is an abnormal elevation of hemoglobin and/or hematocrit in peripheral blood, and this can be caused by primary etiology, ie myeloproliferative disease, or secondary etiology, ie sleep apnea, smoking, testosterone etc  or familiar condition.  I will check erythropoietin, carbo monoxide level, JAK2 with reflex to other mutations, BCR-ABL1 FISH.     Leukocytosis Likely reactive due to smoking.  Pending above workup Check multiple myeloma panel, light chain ratio, LDH, flow cytometry HIV, hepatitis  Smoker Recommend smoke cessation.   Orders Placed This Encounter  Procedures   CBC with Differential/Platelet    Standing Status:   Future    Number of Occurrences:   1    Expected Date:   06/21/2024    Expiration Date:   09/19/2024   Kappa/lambda light chains    Standing Status:   Future    Number of Occurrences:   1    Expected Date:   06/21/2024    Expiration Date:   09/19/2024   Multiple Myeloma Panel (SPEP&IFE w/QIG)    Standing Status:   Future    Number of Occurrences:   1    Expected Date:   06/21/2024    Expiration Date:   09/19/2024   Flow cytometry panel-leukemia/lymphoma work-up    Standing Status:   Future    Number of Occurrences:   1    Expected Date:   06/21/2024    Expiration Date:   09/19/2024   Lactate dehydrogenase    Standing Status:   Future    Number of Occurrences:   1    Expected Date:   06/21/2024    Expiration Date:   09/19/2024   HIV Antibody (routine testing w rflx)    Standing Status:   Future    Number of Occurrences:   1    Expected Date:   06/21/2024    Expiration Date:   09/19/2024   BCR-ABL1 FISH    Standing Status:    Future    Number of Occurrences:   1    Expected Date:   06/21/2024    Expiration Date:   09/19/2024   JAK2 V617F rfx CALR/MPL/E12-15    Standing Status:   Future    Number of Occurrences:   1    Expected Date:   06/21/2024    Expiration Date:   09/19/2024   Hepatitis B surface antigen    Standing Status:   Future    Number of Occurrences:   1    Expected Date:   06/21/2024    Expiration Date:   09/19/2024   Hepatitis B core antibody, IgM    Standing Status:   Future    Number of Occurrences:   1    Expected Date:   06/21/2024    Expiration Date:   09/19/2024   Technologist smear review    Standing Status:   Future    Number of Occurrences:   1    Expected Date:   06/21/2024    Expiration Date:   06/21/2025    Clinical information::   leukocytosis, erythrocytosis   Ambulatory referral to Social Work    Referral Priority:   Routine    Referral Type:  Consultation    Referral Reason:   Specialty Services Required    Number of Visits Requested:   1   Follow-up in a few weeks to go over results. All questions were answered. The patient knows to call the clinic with any problems, questions or concerns.  Zelphia Cap, MD, PhD [facility] Hematology Oncology 06/21/2024   HISTORY OF PRESENTING ILLNESS:   Janice Kaufman is a  33 y.o.  female with PMH listed below was seen in consultation at the request of  Tapia, Leisa, PA-C  for evaluation of erythrocytosis and leukocytosis.  Discussed the use of AI scribe software for clinical note transcription with the patient, who gave verbal consent to proceed.   She has  elevated hemoglobin, elevated blood count, and high white count for several years, with the elevated white count noted as early as 2021. Her hemoglobin levels have varied over the years, with a slight anemia noted four years ago, and more recent levels around 16-17 g/dL. The platelet count is also elevated.  She has a history of smoking. She smokes approximately 20 cigarettes a day,  equivalent to a pack a day.  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  No unintentional weight loss, excessive night sweats, fever, chronic non-healing wounds, or skin rashes. She has a good appetite and feels healthy at baseline. No hemoptysis, although she does have a cough. Bowel movements are normal.   MEDICAL HISTORY:  Past Medical History:  Diagnosis Date   Abnormal Pap smear of cervix    Anxiety    Depression    Diabetes mellitus (HCC)    Type 2   Heart murmur    History of abnormal cervical Pap smear    History of gestational hypertension    History of pre-eclampsia    History of urinary tract infection    Hypertension     SURGICAL HISTORY: Past Surgical History:  Procedure Laterality Date   COLPOSCOPY     HEMORRHOID SURGERY N/A 10/26/2019   Procedure: HEMORRHOIDECTOMY;  Surgeon: Dessa Reyes ORN, MD;  Location: [facility] ORS;  Service: General;  Laterality: N/A;   TUBAL LIGATION Bilateral 12/02/2019   Procedure: POST PARTUM TUBAL LIGATION;  Surgeon: Janit Alm Agent, MD;  Location: [facility] ORS;  Service: Gynecology;  Laterality: Bilateral;    SOCIAL HISTORY: Social History   Socioeconomic History   Marital status: Married    Spouse name: Josh   Number of children: Not on file   Years of education: Not on file   Highest education level: 12th grade  Occupational History   Not on file  Tobacco Use   Smoking status: Every Day    Current packs/day: 0.50    Average packs/day: 0.5 packs/day for 10.0 years (5.0 ttl pk-yrs)    Types: Cigarettes   Smokeless tobacco: Never  Vaping Use   Vaping status: Never Used  Substance and Sexual Activity   Alcohol use: Not Currently    Comment: Haven't had alcohol in three years   Drug use: Never   Sexual activity: Yes    Birth control/protection: Surgical    Comment: Tubal  Other Topics Concern   Not on file  Social History Narrative   Married Josh 4 kids,  moved back to [place] Aug 2024   Social Drivers of Health   Financial Resource Strain: Medium Risk (06/21/2024)   Overall Financial Resource Strain (CARDIA)    Difficulty of Paying Living Expenses:  Somewhat hard  Food Insecurity: Food Insecurity Present (06/21/2024)   Hunger Vital Sign    Worried About Running Out of Food in the Last Year: Sometimes true    Ran Out of Food in the Last Year: Sometimes true  Transportation Needs: No Transportation Needs (06/21/2024)   PRAPARE - Administrator, Civil Service (Medical): No    Lack of Transportation (Non-Medical): No  Physical Activity: Insufficiently Active (02/29/2024)   Exercise Vital Sign    Days of Exercise per Week: 1 day    Minutes of Exercise per Session: 30 min  Stress: Stress Concern Present (06/21/2024)   [employer] of Occupational Health - Occupational Stress Questionnaire    Feeling of Stress: To some extent  Social Connections: Moderately Isolated (02/29/2024)   Social Connection and Isolation Panel    Frequency of Communication with Friends and Family: Twice a week    Frequency of Social Gatherings with Friends and Family: Once a week    Attends Religious Services: Never    Database administrator or Organizations: No    Attends Engineer, structural: Not on file    Marital Status: Married  Catering manager Violence: Not At Risk (06/21/2024)   Humiliation, Afraid, Rape, and Kick questionnaire    Fear of Current or Ex-Partner: No    Emotionally Abused: No    Physically Abused: No    Sexually Abused: No    FAMILY HISTORY: Family History  Problem Relation Age of Onset   Cancer Mother    Diabetes Mother    Thyroid disease Mother    Obesity Mother    Bipolar disorder Mother    Bipolar disorder Sister    Early death Daughter    Breast cancer Paternal Grandmother    Ovarian cancer Paternal Grandmother    Diabetes Paternal Grandmother    Cancer Paternal Grandmother    COPD Paternal Grandmother     Obesity Paternal Grandmother     ALLERGIES:  has no known allergies.  MEDICATIONS:  Current Outpatient Medications  Medication Sig Dispense Refill   busPIRone  (BUSPAR ) 10 MG tablet TAKE ONE-HALF TO TWO TABLET(S) BY MOUTH TWICE DAILY AS NEEDED FOR STRESS/ ANXIETY SYMPTOMS. 180 tablet 3   doxycycline  (ADOXA) 100 MG tablet Take 1 tablet (100 mg total) by mouth 2 (two) times daily. Take 100 mg twice daily for 3 months 60 tablet 2   gabapentin  (NEURONTIN ) 100 MG capsule Take 1 capsule (100 mg total) by mouth 3 (three) times daily. 270 capsule 1   glipiZIDE (GLUCOTROL XL) 5 MG 24 hr tablet Take 5 mg by mouth daily with breakfast.     losartan -hydrochlorothiazide (HYZAAR) 50-12.5 MG tablet Take 0.5 tablets by mouth daily. 45 tablet 0   metFORMIN  (GLUCOPHAGE -XR) 500 MG 24 hr tablet Take 500 mg by mouth 2 (two) times daily with a meal.     pioglitazone (ACTOS) 15 MG tablet Take 15 mg by mouth daily.     Semaglutide ,0.25 or 0.5MG /DOS, (OZEMPIC , 0.25 OR 0.5 MG/DOSE,) 2 MG/3ML SOPN Inject 0.25 mg into the skin once a week.     tacrolimus  (PROTOPIC ) 0.1 % ointment Apply 2 grams twice daily to affected areas of skin 100 g 5   No current facility-administered medications for this visit.    Review of Systems  Constitutional:  Negative for appetite change, chills, fatigue and fever.  HENT:   Negative for hearing loss and voice change.   Eyes:  Negative for eye problems.  Respiratory:  Negative for  chest tightness and cough.   Cardiovascular:  Negative for chest pain.  Gastrointestinal:  Negative for abdominal distention, abdominal pain and blood in stool.  Endocrine: Negative for hot flashes.  Genitourinary:  Negative for difficulty urinating and frequency.   Musculoskeletal:  Negative for arthralgias.  Skin:  Positive for rash. Negative for itching.  Neurological:  Negative for extremity weakness.  Hematological:  Negative for adenopathy.  Psychiatric/Behavioral:  Negative for confusion.     PHYSICAL EXAMINATION:  Vitals:   06/21/24 1203  BP: (!) 137/92  Pulse: 95  Resp: 18  Temp: 97.8 F (36.6 C)  SpO2: 98%   Filed Weights   06/21/24 1203  Weight: 188 lb (85.3 kg)    Physical Exam Constitutional:      General: She is not in acute distress. HENT:     Head: Normocephalic and atraumatic.  Eyes:     General: No scleral icterus. Cardiovascular:     Rate and Rhythm: Normal rate and regular rhythm.     Heart sounds: Normal heart sounds.  Pulmonary:     Effort: Pulmonary effort is normal. No respiratory distress.     Breath sounds: No wheezing.  Abdominal:     General: Bowel sounds are normal. There is no distension.     Palpations: Abdomen is soft.  Musculoskeletal:        General: No deformity. Normal range of motion.     Cervical back: Normal range of motion and neck supple.  Skin:    General: Skin is warm and dry.     Findings: No erythema or rash.  Neurological:     Mental Status: She is alert and oriented to person, place, and time. Mental status is at baseline.     Cranial Nerves: No cranial nerve deficit.     Coordination: Coordination normal.  Psychiatric:        Mood and Affect: Mood normal.     LABORATORY DATA:  I have reviewed the data as listed    Latest Ref Rng & Units 06/21/2024   12:27 PM 06/08/2024   12:42 PM 04/27/2024    4:04 PM  CBC  WBC 4.0 - 10.5 K/uL 14.7  17.1  14.2   Hemoglobin 12.0 - 15.0 g/dL 82.7  83.9  83.0   Hematocrit 36.0 - 46.0 % 49.1  45.8  50.5   Platelets 150 - 400 K/uL 364  422  401       Latest Ref Rng & Units 04/27/2024    4:04 PM 02/17/2024   12:00 AM 11/11/2023   12:06 PM  CMP  Glucose 65 - 99 mg/dL 776   586   BUN 7 - 25 mg/dL 16  10     14    Creatinine 0.50 - 0.97 mg/dL 9.38  0.6     9.32   Sodium 135 - 146 mmol/L 136  138     135   Potassium 3.5 - 5.3 mmol/L 4.4  4.3     3.7   Chloride 98 - 110 mmol/L 103  100     104   CO2 20 - 32 mmol/L 23  18     20    Calcium  8.6 - 10.2 mg/dL 9.5  9.4     9.3    Total Protein 6.1 - 8.1 g/dL 6.8     Total Bilirubin 0.2 - 1.2 mg/dL 0.3     Alkaline Phos 25 - 125  152       AST 10 - 30  U/L 11  12       ALT 6 - 29 U/L 15  20          This result is from an external source.      RADIOGRAPHIC STUDIES: I have personally reviewed the radiological images as listed and agreed with the findings in the report. MM 3D DIAGNOSTIC MAMMOGRAM BILATERAL BREAST Result Date: 05/24/2024 CLINICAL DATA:  Diffuse LEFT breast pain for several months. Additionally has a LEFT axillary palpable abnormality for several months. First mammogram. EXAM: DIGITAL DIAGNOSTIC BILATERAL MAMMOGRAM WITH TOMOSYNTHESIS AND CAD; ULTRASOUND LEFT BREAST LIMITED TECHNIQUE: Bilateral digital diagnostic mammography and breast tomosynthesis was performed. The images were evaluated with computer-aided detection. ; Targeted ultrasound examination of the left breast was performed. COMPARISON:  Previous exam(s). ACR Breast Density Category b: There are scattered areas of fibroglandular density. FINDINGS: No mammographic evidence of malignancy in the RIGHT breast. No mammographic etiology for nonfocal LEFT breast pain identified. Spot compression tomosynthesis view was obtained over the palpable area of concern in the LEFT axilla. No suspicious mammographic finding is identified in this area. There is no mammographic evidence of malignancy in the LEFT breast. Targeted LEFT breast ultrasound was performed in the palpable area of concern in the LEFT axilla. A superficial oval hypoechoic mass with visible tract to the skin surface compatible with a benign epidermal inclusion cyst is seen at the palpable area of concern. IMPRESSION: 1. LEFT axillary palpable abnormality correlates with a benign epidermal inclusion cyst. 2. There is no mammographic evidence of malignancy in EITHER breast. 3. No mammographic etiology for nonfocal LEFT breast pain identified. Breast pain is a common condition, which will often resolve  on its own without intervention. It can be affected by hormonal changes, medication side effect, weight changes and fit of the bra. Pain may also be referred from other adjacent areas of the body. Breast pain may be improved by wearing adequate well-fitting support, over-the-counter topical and oral NSAID medication, low-fat diet, and ice/heat as needed. Studies have shown an improvement in cyclic pain with use of evening primrose oil, vitamin D and vitamin E. Clinical follow-up recommended to discuss any further work-up recommendations and appropriate treatment, which should be based on the clinical assessment. Findings and recommendations were discussed with the patient in person. RECOMMENDATION: Clinical follow-up of LEFT breast pain. Routine screening mammogram beginning at age 58. I have discussed the findings and recommendations with the patient. If applicable, a reminder letter will be sent to the patient regarding the next appointment. BI-RADS CATEGORY  2: Benign. Electronically Signed   By: Norleen Croak M.D.   On: 05/24/2024 09:54   US  AXILLA LEFT Result Date: 05/24/2024 CLINICAL DATA:  Diffuse LEFT breast pain for several months. Additionally has a LEFT axillary palpable abnormality for several months. First mammogram. EXAM: DIGITAL DIAGNOSTIC BILATERAL MAMMOGRAM WITH TOMOSYNTHESIS AND CAD; ULTRASOUND LEFT BREAST LIMITED TECHNIQUE: Bilateral digital diagnostic mammography and breast tomosynthesis was performed. The images were evaluated with computer-aided detection. ; Targeted ultrasound examination of the left breast was performed. COMPARISON:  Previous exam(s). ACR Breast Density Category b: There are scattered areas of fibroglandular density. FINDINGS: No mammographic evidence of malignancy in the RIGHT breast. No mammographic etiology for nonfocal LEFT breast pain identified. Spot compression tomosynthesis view was obtained over the palpable area of concern in the LEFT axilla. No suspicious  mammographic finding is identified in this area. There is no mammographic evidence of malignancy in the LEFT breast. Targeted LEFT breast ultrasound was performed in  the palpable area of concern in the LEFT axilla. A superficial oval hypoechoic mass with visible tract to the skin surface compatible with a benign epidermal inclusion cyst is seen at the palpable area of concern. IMPRESSION: 1. LEFT axillary palpable abnormality correlates with a benign epidermal inclusion cyst. 2. There is no mammographic evidence of malignancy in EITHER breast. 3. No mammographic etiology for nonfocal LEFT breast pain identified. Breast pain is a common condition, which will often resolve on its own without intervention. It can be affected by hormonal changes, medication side effect, weight changes and fit of the bra. Pain may also be referred from other adjacent areas of the body. Breast pain may be improved by wearing adequate well-fitting support, over-the-counter topical and oral NSAID medication, low-fat diet, and ice/heat as needed. Studies have shown an improvement in cyclic pain with use of evening primrose oil, vitamin D and vitamin E. Clinical follow-up recommended to discuss any further work-up recommendations and appropriate treatment, which should be based on the clinical assessment. Findings and recommendations were discussed with the patient in person. RECOMMENDATION: Clinical follow-up of LEFT breast pain. Routine screening mammogram beginning at age 51. I have discussed the findings and recommendations with the patient. If applicable, a reminder letter will be sent to the patient regarding the next appointment. BI-RADS CATEGORY  2: Benign. Electronically Signed   By: Norleen Croak M.D.   On: 05/24/2024 09:54

## 2024-06-21 NOTE — Assessment & Plan Note (Signed)
 Labs are reviewed and discussed with patient.  Likely secondary to smoking Erythrocytosis is an abnormal elevation of hemoglobin and/or hematocrit in peripheral blood, and this can be caused by primary etiology, ie myeloproliferative disease, or secondary etiology, ie sleep apnea, smoking, testosterone etc  or familiar condition.  I will check erythropoietin, carbo monoxide level, JAK2 with reflex to other mutations, BCR-ABL1 FISH.

## 2024-06-21 NOTE — Assessment & Plan Note (Addendum)
 Likely reactive due to smoking.  Pending above workup Check multiple myeloma panel, light chain ratio, LDH, flow cytometry HIV, hepatitis

## 2024-06-22 ENCOUNTER — Inpatient Hospital Stay: Admitting: Licensed Clinical Social Worker

## 2024-06-22 LAB — KAPPA/LAMBDA LIGHT CHAINS
Kappa free light chain: 20.5 mg/L — ABNORMAL HIGH (ref 3.3–19.4)
Kappa, lambda light chain ratio: 0.9 (ref 0.26–1.65)
Lambda free light chains: 22.8 mg/L (ref 5.7–26.3)

## 2024-06-22 NOTE — Progress Notes (Signed)
 [facility] Clinical Social Work  Clinical Social Work was referred by medical provider for emotional support.  Clinical Social Worker contacted patient by phone to offer support and assess for needs.     Interventions: Provided brief mental health counseling with regard to her medical condition.  She reported feeling depressed and has a diagnosis of bipolar.  Patient has a Theme park manager.  Patient admitted to feeling better today.   She has good support with no thoughts of harming herself.      Follow Up Plan:  CSW will follow-up with patient by phone after she meets with Dr. Babara again.    Macario CHRISTELLA Au, LCSW  Clinical Social Worker [facility]

## 2024-06-23 LAB — MULTIPLE MYELOMA PANEL, SERUM
Albumin SerPl Elph-Mcnc: 3.3 g/dL (ref 2.9–4.4)
Albumin/Glob SerPl: 1.1 (ref 0.7–1.7)
Alpha 1: 0.2 g/dL (ref 0.0–0.4)
Alpha2 Glob SerPl Elph-Mcnc: 0.9 g/dL (ref 0.4–1.0)
B-Globulin SerPl Elph-Mcnc: 1.4 g/dL — ABNORMAL HIGH (ref 0.7–1.3)
Gamma Glob SerPl Elph-Mcnc: 0.7 g/dL (ref 0.4–1.8)
Globulin, Total: 3.2 g/dL (ref 2.2–3.9)
IgA: 328 mg/dL (ref 87–352)
IgG (Immunoglobin G), Serum: 708 mg/dL (ref 586–1602)
IgM (Immunoglobulin M), Srm: 73 mg/dL (ref 26–217)
Total Protein ELP: 6.5 g/dL (ref 6.0–8.5)

## 2024-06-27 LAB — COMP PANEL: LEUKEMIA/LYMPHOMA

## 2024-06-27 LAB — BCR-ABL1 FISH
Cells Analyzed: 200
Cells Counted: 200

## 2024-06-28 LAB — CALR +MPL + E12-E15  (REFLEX)

## 2024-06-28 LAB — JAK2 V617F RFX CALR/MPL/E12-15

## 2024-07-07 ENCOUNTER — Ambulatory Visit: Payer: Self-pay | Admitting: Podiatry

## 2024-07-07 DIAGNOSIS — L600 Ingrowing nail: Secondary | ICD-10-CM | POA: Diagnosis not present

## 2024-07-07 NOTE — Progress Notes (Signed)
 Subjective:  Patient ID: Janice Kaufman, female    DOB: Aug 18, 1991,  MRN: [id number]  Chief Complaint  Patient presents with   Ingrown Toenail    33 y.o. female presents with the above complaint.  Patient presents with left hallux medial border ingrown painful to touch is progressing and worse worse with ambulation worse with pressure patient was last A1c was 10% and he would like to have it removed however is concerned for his A1c.  He would like to discuss maybe taking it out next month.  Pain scale 7 out of 10 dull aching nature   Review of Systems: Negative except as noted in the HPI. Denies N/V/F/Ch.  Past Medical History:  Diagnosis Date   Abnormal Pap smear of cervix    Anxiety    Depression    Diabetes mellitus (HCC)    Type 2   Heart murmur    History of abnormal cervical Pap smear    History of gestational hypertension    History of pre-eclampsia    History of urinary tract infection    Hypertension     Current Outpatient Medications:    busPIRone  (BUSPAR ) 10 MG tablet, TAKE ONE-HALF TO TWO TABLET(S) BY MOUTH TWICE DAILY AS NEEDED FOR STRESS/ ANXIETY SYMPTOMS., Disp: 180 tablet, Rfl: 3   doxycycline  (ADOXA) 100 MG tablet, Take 1 tablet (100 mg total) by mouth 2 (two) times daily. Take 100 mg twice daily for 3 months, Disp: 60 tablet, Rfl: 2   gabapentin  (NEURONTIN ) 100 MG capsule, Take 1 capsule (100 mg total) by mouth 3 (three) times daily., Disp: 270 capsule, Rfl: 1   glipiZIDE (GLUCOTROL XL) 5 MG 24 hr tablet, Take 5 mg by mouth daily with breakfast., Disp: , Rfl:    lamoTRIgine (LAMICTAL) 25 MG tablet, , Disp: , Rfl:    losartan -hydrochlorothiazide (HYZAAR) 50-12.5 MG tablet, Take 0.5 tablets by mouth daily., Disp: 45 tablet, Rfl: 0   metFORMIN  (GLUCOPHAGE -XR) 500 MG 24 hr tablet, Take 500 mg by mouth 2 (two) times daily with a meal., Disp: , Rfl:    pioglitazone (ACTOS) 15 MG tablet, Take 15 mg by mouth daily., Disp: , Rfl:    Semaglutide ,0.25 or 0.5MG /DOS,  (OZEMPIC , 0.25 OR 0.5 MG/DOSE,) 2 MG/3ML SOPN, Inject 0.25 mg into the skin once a week., Disp: , Rfl:    tacrolimus  (PROTOPIC ) 0.1 % ointment, Apply 2 grams twice daily to affected areas of skin, Disp: 100 g, Rfl: 5  Social History   Tobacco Use  Smoking Status Every Day   Current packs/day: 0.50   Average packs/day: 0.5 packs/day for 10.0 years (5.0 ttl pk-yrs)   Types: Cigarettes  Smokeless Tobacco Never    No Known Allergies Objective:  There were no vitals filed for this visit. There is no height or weight on file to calculate BMI. Constitutional Well developed. Well nourished.  Vascular Dorsalis pedis pulses palpable bilaterally. Posterior tibial pulses palpable bilaterally. Capillary refill normal to all digits.  No cyanosis or clubbing noted. Pedal hair growth normal.  Neurologic Normal speech. Oriented to person, place, and time. Epicritic sensation to light touch grossly present bilaterally.  Dermatologic Painful ingrowing nail at medial nail borders of the hallux nail left. No other open wounds. No skin lesions.  Orthopedic: Normal joint ROM without pain or crepitus bilaterally. No visible deformities. No bony tenderness.   Radiographs: None Assessment:   1. Ingrown left big toenail    Plan:  Patient was evaluated and treated and all questions answered.  Ingrown  Nail, left - Clinically I discussed with her that given her uncontrolled A1c of 10 with pain to the ingrown we cannot remove it.  I discussed with her to bring her A1c down below 8% as she is at high risk of losing the toe.  She states understanding.  She will work on her ABCs and will get back to me when she is ready. - Slant back procedure was performed in standard technique no complication noted no bleeding noted  No follow-ups on file.

## 2024-07-13 ENCOUNTER — Encounter: Payer: Self-pay | Admitting: Oncology

## 2024-07-13 ENCOUNTER — Inpatient Hospital Stay (HOSPITAL_BASED_OUTPATIENT_CLINIC_OR_DEPARTMENT_OTHER): Admitting: Oncology

## 2024-07-13 VITALS — BP 140/91 | HR 118 | Temp 98.3°F | Resp 16 | Wt 188.0 lb

## 2024-07-13 DIAGNOSIS — F17200 Nicotine dependence, unspecified, uncomplicated: Secondary | ICD-10-CM

## 2024-07-13 DIAGNOSIS — D72829 Elevated white blood cell count, unspecified: Secondary | ICD-10-CM

## 2024-07-13 DIAGNOSIS — D751 Secondary polycythemia: Secondary | ICD-10-CM | POA: Diagnosis not present

## 2024-07-13 NOTE — Progress Notes (Signed)
 Hematology/Oncology Consult note [phone] [phone]        REFERRING PROVIDER: Leavy Mole, PA-C   CHIEF COMPLAINTS/REASON FOR VISIT:  Evaluation of erythrocytosis and leukocytosis.   ASSESSMENT & PLAN:   Erythrocytosis Labs are reviewed and discussed with patient.   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 JAK2 V617F mutation negative, with reflex to other mutations CALR, MPL, JAK 2 Ex 12-15 mutations negative.  Negative BCR-ABL1 FISH  Recommend smoke cessation.  No need for phlebotomy  Leukocytosis Likely reactive due to smoking.  Pending above workup No M protein on multiple myeloma panel, normal light chain ratio, negative peripheral blood flow cytometry HIV, hepatitis  Smoker Recommend smoke cessation.  Follow-up as needed All questions were answered. The patient knows to call the clinic with any problems, questions or concerns.  Zelphia Cap, MD, PhD [facility] Hematology Oncology 07/13/2024   HISTORY OF PRESENTING ILLNESS:   Janice Kaufman is a  33 y.o.  female with PMH listed below was seen in consultation at the request of  Tapia, Leisa, PA-C  for evaluation of erythrocytosis and leukocytosis.  Discussed the use of AI scribe software for clinical note transcription with the patient, who gave verbal consent to proceed.   She has  elevated hemoglobin, elevated blood count, and high white count for several years, with the elevated white count noted as early as 2021. Her hemoglobin levels have varied over the years, with a slight anemia noted four years ago, and more recent levels around 16-17 g/dL. The platelet count is also elevated.  She has a history of smoking. She smokes approximately 20 cigarettes a day, equivalent to a pack a day.  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  No unintentional weight loss, excessive night sweats, fever, chronic  non-healing wounds, or skin rashes. She has a good appetite and feels healthy at baseline. No hemoptysis, although she does have a cough. Bowel movements are normal.   INTERVAL HISTORY Janice Kaufman is a 33 y.o. female who has above history reviewed by me today presents for follow up visit for erythrocytosis and leukocytosis. Patient reports feeling well.  She presents to discuss results.  MEDICAL HISTORY:  Past Medical History:  Diagnosis Date   Abnormal Pap smear of cervix    Anxiety    Depression    Diabetes mellitus (HCC)    Type 2   Heart murmur    History of abnormal cervical Pap smear    History of gestational hypertension    History of pre-eclampsia    History of urinary tract infection    Hypertension     SURGICAL HISTORY: Past Surgical History:  Procedure Laterality Date   COLPOSCOPY     HEMORRHOID SURGERY N/A 10/26/2019   Procedure: HEMORRHOIDECTOMY;  Surgeon: Dessa Reyes ORN, MD;  Location: [facility] ORS;  Service: General;  Laterality: N/A;   TUBAL LIGATION Bilateral 12/02/2019   Procedure: POST PARTUM TUBAL LIGATION;  Surgeon: Janit Alm Agent, MD;  Location: [facility] ORS;  Service: Gynecology;  Laterality: Bilateral;    SOCIAL HISTORY: Social History   Socioeconomic History   Marital status: Married    Spouse name: Josh   Number of children: Not on file   Years of education: Not on file   Highest education level: 12th grade  Occupational History   Not on file  Tobacco Use   Smoking status: Every Day    Current packs/day: 0.50  Average packs/day: 0.5 packs/day for 10.0 years (5.0 ttl pk-yrs)    Types: Cigarettes   Smokeless tobacco: Never  Vaping Use   Vaping status: Never Used  Substance and Sexual Activity   Alcohol use: Not Currently    Comment: Haven't had alcohol in three years   Drug use: Never   Sexual activity: Yes    Birth control/protection: Surgical    Comment: Tubal  Other Topics Concern   Not on file  Social History Narrative    Married Josh 4 kids, moved back to [place] Aug 2024   Social Drivers of Health   Financial Resource Strain: Medium Risk (06/21/2024)   Overall Financial Resource Strain (CARDIA)    Difficulty of Paying Living Expenses: Somewhat hard  Food Insecurity: Food Insecurity Present (06/21/2024)   Hunger Vital Sign    Worried About Running Out of Food in the Last Year: Sometimes true    Ran Out of Food in the Last Year: Sometimes true  Transportation Needs: No Transportation Needs (06/21/2024)   PRAPARE - Administrator, Civil Service (Medical): No    Lack of Transportation (Non-Medical): No  Physical Activity: Insufficiently Active (02/29/2024)   Exercise Vital Sign    Days of Exercise per Week: 1 day    Minutes of Exercise per Session: 30 min  Stress: Stress Concern Present (06/21/2024)   [employer] of Occupational Health - Occupational Stress Questionnaire    Feeling of Stress: To some extent  Social Connections: Moderately Isolated (02/29/2024)   Social Connection and Isolation Panel    Frequency of Communication with Friends and Family: Twice a week    Frequency of Social Gatherings with Friends and Family: Once a week    Attends Religious Services: Never    Database Administrator or Organizations: No    Attends Engineer, Structural: Not on file    Marital Status: Married  Catering Manager Violence: Not At Risk (06/21/2024)   Humiliation, Afraid, Rape, and Kick questionnaire    Fear of Current or Ex-Partner: No    Emotionally Abused: No    Physically Abused: No    Sexually Abused: No    FAMILY HISTORY: Family History  Problem Relation Age of Onset   Cancer Mother    Diabetes Mother    Thyroid disease Mother    Obesity Mother    Bipolar disorder Mother    Bipolar disorder Sister    Early death Daughter    Breast cancer Paternal Grandmother    Ovarian cancer Paternal Grandmother    Diabetes Paternal Grandmother    Cancer Paternal Grandmother    COPD  Paternal Grandmother    Obesity Paternal Grandmother     ALLERGIES:  has no known allergies.  MEDICATIONS:  Current Outpatient Medications  Medication Sig Dispense Refill   busPIRone  (BUSPAR ) 10 MG tablet TAKE ONE-HALF TO TWO TABLET(S) BY MOUTH TWICE DAILY AS NEEDED FOR STRESS/ ANXIETY SYMPTOMS. 180 tablet 3   doxycycline  (ADOXA) 100 MG tablet Take 1 tablet (100 mg total) by mouth 2 (two) times daily. Take 100 mg twice daily for 3 months 60 tablet 2   gabapentin  (NEURONTIN ) 100 MG capsule Take 1 capsule (100 mg total) by mouth 3 (three) times daily. 270 capsule 1   glipiZIDE (GLUCOTROL XL) 5 MG 24 hr tablet Take 5 mg by mouth daily with breakfast.     lamoTRIgine (LAMICTAL) 25 MG tablet      losartan -hydrochlorothiazide (HYZAAR) 50-12.5 MG tablet Take 0.5 tablets by  mouth daily. 45 tablet 0   metFORMIN  (GLUCOPHAGE -XR) 500 MG 24 hr tablet Take 500 mg by mouth 2 (two) times daily with a meal.     pioglitazone (ACTOS) 15 MG tablet Take 15 mg by mouth daily.     Semaglutide ,0.25 or 0.5MG /DOS, (OZEMPIC , 0.25 OR 0.5 MG/DOSE,) 2 MG/3ML SOPN Inject 0.25 mg into the skin once a week.     tacrolimus  (PROTOPIC ) 0.1 % ointment Apply 2 grams twice daily to affected areas of skin 100 g 5   No current facility-administered medications for this visit.    Review of Systems  Constitutional:  Negative for appetite change, chills, fatigue and fever.  HENT:   Negative for hearing loss and voice change.   Eyes:  Negative for eye problems.  Respiratory:  Negative for chest tightness and cough.   Cardiovascular:  Negative for chest pain.  Gastrointestinal:  Negative for abdominal distention, abdominal pain and blood in stool.  Endocrine: Negative for hot flashes.  Genitourinary:  Negative for difficulty urinating and frequency.   Musculoskeletal:  Negative for arthralgias.  Skin:  Positive for rash. Negative for itching.  Neurological:  Negative for extremity weakness.  Hematological:  Negative for  adenopathy.  Psychiatric/Behavioral:  Negative for confusion.    PHYSICAL EXAMINATION:  Vitals:   07/13/24 1502  BP: (!) 140/91  Pulse: (!) 118  Resp: 16  Temp: 98.3 F (36.8 C)  SpO2: 95%   Filed Weights   07/13/24 1502  Weight: 188 lb (85.3 kg)    Physical Exam Constitutional:      General: She is not in acute distress. HENT:     Head: Normocephalic and atraumatic.  Eyes:     General: No scleral icterus. Cardiovascular:     Rate and Rhythm: Normal rate.  Pulmonary:     Effort: Pulmonary effort is normal. No respiratory distress.  Abdominal:     General: There is no distension.  Musculoskeletal:        General: No deformity. Normal range of motion.     Cervical back: Normal range of motion and neck supple.  Skin:    Findings: No rash.  Neurological:     Mental Status: She is alert and oriented to person, place, and time. Mental status is at baseline.  Psychiatric:        Mood and Affect: Mood normal.     LABORATORY DATA:  I have reviewed the data as listed    Latest Ref Rng & Units 06/21/2024   12:27 PM 06/08/2024   12:42 PM 04/27/2024    4:04 PM  CBC  WBC 4.0 - 10.5 K/uL 14.7  17.1  14.2   Hemoglobin 12.0 - 15.0 g/dL 82.7  83.9  83.0   Hematocrit 36.0 - 46.0 % 49.1  45.8  50.5   Platelets 150 - 400 K/uL 364  422  401       Latest Ref Rng & Units 04/27/2024    4:04 PM 02/17/2024   12:00 AM 11/11/2023   12:06 PM  CMP  Glucose 65 - 99 mg/dL 776   586   BUN 7 - 25 mg/dL 16  10     14    Creatinine 0.50 - 0.97 mg/dL 9.38  0.6     9.32   Sodium 135 - 146 mmol/L 136  138     135   Potassium 3.5 - 5.3 mmol/L 4.4  4.3     3.7   Chloride 98 - 110 mmol/L 103  100  104   CO2 20 - 32 mmol/L 23  18     20    Calcium  8.6 - 10.2 mg/dL 9.5  9.4     9.3   Total Protein 6.1 - 8.1 g/dL 6.8     Total Bilirubin 0.2 - 1.2 mg/dL 0.3     Alkaline Phos 25 - 125  152       AST 10 - 30 U/L 11  12       ALT 6 - 29 U/L 15  20          This result is from an external source.       RADIOGRAPHIC STUDIES: I have personally reviewed the radiological images as listed and agreed with the findings in the report. MM 3D DIAGNOSTIC MAMMOGRAM BILATERAL BREAST Result Date: 05/24/2024 CLINICAL DATA:  Diffuse LEFT breast pain for several months. Additionally has a LEFT axillary palpable abnormality for several months. First mammogram. EXAM: DIGITAL DIAGNOSTIC BILATERAL MAMMOGRAM WITH TOMOSYNTHESIS AND CAD; ULTRASOUND LEFT BREAST LIMITED TECHNIQUE: Bilateral digital diagnostic mammography and breast tomosynthesis was performed. The images were evaluated with computer-aided detection. ; Targeted ultrasound examination of the left breast was performed. COMPARISON:  Previous exam(s). ACR Breast Density Category b: There are scattered areas of fibroglandular density. FINDINGS: No mammographic evidence of malignancy in the RIGHT breast. No mammographic etiology for nonfocal LEFT breast pain identified. Spot compression tomosynthesis view was obtained over the palpable area of concern in the LEFT axilla. No suspicious mammographic finding is identified in this area. There is no mammographic evidence of malignancy in the LEFT breast. Targeted LEFT breast ultrasound was performed in the palpable area of concern in the LEFT axilla. A superficial oval hypoechoic mass with visible tract to the skin surface compatible with a benign epidermal inclusion cyst is seen at the palpable area of concern. IMPRESSION: 1. LEFT axillary palpable abnormality correlates with a benign epidermal inclusion cyst. 2. There is no mammographic evidence of malignancy in EITHER breast. 3. No mammographic etiology for nonfocal LEFT breast pain identified. Breast pain is a common condition, which will often resolve on its own without intervention. It can be affected by hormonal changes, medication side effect, weight changes and fit of the bra. Pain may also be referred from other adjacent areas of the body. Breast pain may be  improved by wearing adequate well-fitting support, over-the-counter topical and oral NSAID medication, low-fat diet, and ice/heat as needed. Studies have shown an improvement in cyclic pain with use of evening primrose oil, vitamin D and vitamin E. Clinical follow-up recommended to discuss any further work-up recommendations and appropriate treatment, which should be based on the clinical assessment. Findings and recommendations were discussed with the patient in person. RECOMMENDATION: Clinical follow-up of LEFT breast pain. Routine screening mammogram beginning at age 20. I have discussed the findings and recommendations with the patient. If applicable, a reminder letter will be sent to the patient regarding the next appointment. BI-RADS CATEGORY  2: Benign. Electronically Signed   By: Norleen Croak M.D.   On: 05/24/2024 09:54   US  AXILLA LEFT Result Date: 05/24/2024 CLINICAL DATA:  Diffuse LEFT breast pain for several months. Additionally has a LEFT axillary palpable abnormality for several months. First mammogram. EXAM: DIGITAL DIAGNOSTIC BILATERAL MAMMOGRAM WITH TOMOSYNTHESIS AND CAD; ULTRASOUND LEFT BREAST LIMITED TECHNIQUE: Bilateral digital diagnostic mammography and breast tomosynthesis was performed. The images were evaluated with computer-aided detection. ; Targeted ultrasound examination of the left breast was performed. COMPARISON:  Previous exam(s). ACR Breast  Density Category b: There are scattered areas of fibroglandular density. FINDINGS: No mammographic evidence of malignancy in the RIGHT breast. No mammographic etiology for nonfocal LEFT breast pain identified. Spot compression tomosynthesis view was obtained over the palpable area of concern in the LEFT axilla. No suspicious mammographic finding is identified in this area. There is no mammographic evidence of malignancy in the LEFT breast. Targeted LEFT breast ultrasound was performed in the palpable area of concern in the LEFT axilla. A  superficial oval hypoechoic mass with visible tract to the skin surface compatible with a benign epidermal inclusion cyst is seen at the palpable area of concern. IMPRESSION: 1. LEFT axillary palpable abnormality correlates with a benign epidermal inclusion cyst. 2. There is no mammographic evidence of malignancy in EITHER breast. 3. No mammographic etiology for nonfocal LEFT breast pain identified. Breast pain is a common condition, which will often resolve on its own without intervention. It can be affected by hormonal changes, medication side effect, weight changes and fit of the bra. Pain may also be referred from other adjacent areas of the body. Breast pain may be improved by wearing adequate well-fitting support, over-the-counter topical and oral NSAID medication, low-fat diet, and ice/heat as needed. Studies have shown an improvement in cyclic pain with use of evening primrose oil, vitamin D and vitamin E. Clinical follow-up recommended to discuss any further work-up recommendations and appropriate treatment, which should be based on the clinical assessment. Findings and recommendations were discussed with the patient in person. RECOMMENDATION: Clinical follow-up of LEFT breast pain. Routine screening mammogram beginning at age 33. I have discussed the findings and recommendations with the patient. If applicable, a reminder letter will be sent to the patient regarding the next appointment. BI-RADS CATEGORY  2: Benign. Electronically Signed   By: Norleen Croak M.D.   On: 05/24/2024 09:54

## 2024-07-13 NOTE — Assessment & Plan Note (Signed)
 Likely reactive due to smoking.  Pending above workup No M protein on multiple myeloma panel, normal light chain ratio, negative peripheral blood flow cytometry HIV, hepatitis

## 2024-07-13 NOTE — Assessment & Plan Note (Addendum)
 Labs are reviewed and discussed with patient.   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 JAK2 V617F mutation negative, with reflex to other mutations CALR, MPL, JAK 2 Ex 12-15 mutations negative.  Negative BCR-ABL1 FISH  Recommend smoke cessation.  No need for phlebotomy

## 2024-07-13 NOTE — Assessment & Plan Note (Signed)
 Recommend smoke cessation.

## 2024-07-28 ENCOUNTER — Ambulatory Visit: Attending: Nurse Practitioner

## 2024-07-28 ENCOUNTER — Encounter: Payer: Self-pay | Admitting: Nurse Practitioner

## 2024-07-28 ENCOUNTER — Ambulatory Visit: Admitting: Nurse Practitioner

## 2024-07-28 VITALS — BP 178/102 | HR 99 | Temp 97.6°F | Ht 66.0 in | Wt 183.0 lb

## 2024-07-28 DIAGNOSIS — R002 Palpitations: Secondary | ICD-10-CM

## 2024-07-28 DIAGNOSIS — F419 Anxiety disorder, unspecified: Secondary | ICD-10-CM

## 2024-07-28 DIAGNOSIS — I10 Essential (primary) hypertension: Secondary | ICD-10-CM | POA: Diagnosis not present

## 2024-07-28 DIAGNOSIS — G629 Polyneuropathy, unspecified: Secondary | ICD-10-CM

## 2024-07-28 DIAGNOSIS — E1165 Type 2 diabetes mellitus with hyperglycemia: Secondary | ICD-10-CM | POA: Diagnosis not present

## 2024-07-28 DIAGNOSIS — Z7984 Long term (current) use of oral hypoglycemic drugs: Secondary | ICD-10-CM

## 2024-07-28 LAB — POCT GLYCOSYLATED HEMOGLOBIN (HGB A1C): Hemoglobin A1C: 10.7 % — AB (ref 4.0–5.6)

## 2024-07-28 MED ORDER — LANCETS MISC
1.0000 | 0 refills | Status: AC
Start: 2024-07-28 — End: ?

## 2024-07-28 MED ORDER — LANCET DEVICE MISC: 1.0000 | 0 refills | Status: AC

## 2024-07-28 MED ORDER — METFORMIN HCL ER 500 MG PO TB24: 1000.0000 mg | ORAL_TABLET | Freq: Every day | ORAL | 0 refills | Status: DC

## 2024-07-28 MED ORDER — HYDROCHLOROTHIAZIDE 12.5 MG PO TABS: 12.5000 mg | ORAL_TABLET | Freq: Every day | ORAL | 0 refills | Status: AC

## 2024-07-28 MED ORDER — BLOOD GLUCOSE MONITORING SUPPL DEVI: 1.0000 | 0 refills | Status: AC

## 2024-07-28 MED ORDER — AMLODIPINE BESYLATE 5 MG PO TABS: 5.0000 mg | ORAL_TABLET | Freq: Every day | ORAL | 0 refills | Status: DC

## 2024-07-28 MED ORDER — LANTUS SOLOSTAR 100 UNIT/ML ~~LOC~~ SOPN: 10.0000 [IU] | PEN_INJECTOR | Freq: Every day | SUBCUTANEOUS | 99 refills | Status: DC

## 2024-07-28 MED ORDER — BLOOD GLUCOSE TEST VI STRP: 1.0000 | ORAL_STRIP | 0 refills | Status: AC

## 2024-07-28 NOTE — Assessment & Plan Note (Signed)
 Was previously taking losartan -hydrochlorothiazide. BP today is 200/102 and 178/102. With patient being child bearing age and potential to get pregnant changing medication to amlodipine 5 mg + hydrochlorothiazide 12.5 mg. Please begin taking these medications daily. Plan to recheck BP at one week follow-up.

## 2024-07-28 NOTE — Assessment & Plan Note (Signed)
 Continue taking buspar  and follow-up with psychiatry.

## 2024-07-28 NOTE — Assessment & Plan Note (Signed)
 POCT HgbA1C today is 10.7 She is not currently taking her metformin , glipizide, or semaglutide . Due to costs and running out of medication. We are starting patient on insulin  10 units once daily to help control her blood sugar and A1C. Glucometer supplies sent in. Discussed the importance of checking her blood sugar one a day. Patient to follow-up in one week and bring recorded readings.

## 2024-07-28 NOTE — Progress Notes (Signed)
 BP (!) 178/102   Pulse 99   Temp 97.6 F (36.4 C)   Ht 5' 6 (1.676 m)   Wt 183 lb (83 kg)   SpO2 96%   BMI 29.54 kg/m    Subjective:    Patient ID: Janice Kaufman, female    DOB: 02/25/1991, 33 y.o.   MRN: [id number]  HPI: Janice Kaufman is a 33 y.o. female presenting today for a 3 month follow-up for chronic conditions including, type 2 diabetes and hypertension. For her diabetes she is prescribed metformin  500 mg twice a day, glipizide 5 mg daily , and semaglutide  injection 0.25 mg.However she reports that she hasn't taken these medications in 2 months due to running out of medication. She was given a sample of the Semaglutide  in office at her visit in August, however when sample ran out she reports not being able to afford medication at the pharmacy. She also endorses bad GI side effects with injection so she was not interested in taking it again. She reports having a dexcom at home, but does not use it. She endorses neuropathy in both hands and feet. She takes gabapentin  100 mg three times daily, which helps. Was previously followed by [facility] which is a virtual diabetes management program, however she no longer sees them. Last A1C 3 months ago was 10.2. Checking POCT Hemoglobin A1C today. Her weight today is 183 lbs with a BMI of 29.54.   For hypertension she is prescribed losartan -hydrochlorothiazide daily, however she has not taken medication in 2 months. She ran out of medication and has not gotten a refill. She denies checking her blood pressure at home, and does not following a specific eating plan. Blood pressure today 200/102 and 178/102. She denies current headache, blurred vision, dizziness, or weakness.   She is followed by [facility] for anxiety and takes buspar  as needed for symptoms, which patient reports helps somewhat.  She also endorses palpitations that started around 2 months ago. She denies chest pain, shortness of breath, dizziness, blurred vision, or  headaches. The palpitations are intermittent and can happen at rest or with activity. She denies any specific triggers.     EKG: normal EKG, normal sinus rhythm.      07/28/2024   11:21 AM 07/13/2024    3:02 PM 06/21/2024    1:56 PM  Depression screen PHQ 2/9  Decreased Interest 1 0 3  Down, Depressed, Hopeless 1 0 3  PHQ - 2 Score 2 0 6  Altered sleeping 2 0 1  Tired, decreased energy 1 0 1  Change in appetite 3 0 1  Feeling bad or failure about yourself  2 0 0  Trouble concentrating 1 0 0  Moving slowly or fidgety/restless 1 0 0  Suicidal thoughts 0 0 0  PHQ-9 Score 12 0  9      Data saved with a previous flowsheet row definition        07/28/2024   11:22 AM 04/27/2024    3:22 PM 03/07/2024    9:27 AM  GAD 7 : Generalized Anxiety Score  Nervous, Anxious, on Edge 2 3 3   Control/stop worrying 3 3 3   Worry too much - different things 2 3 3   Trouble relaxing 2 3 3   Restless 1 0 3  Easily annoyed or irritable 3 3 3   Afraid - awful might happen 3 3 3   Total GAD 7 Score 16 18 21       Relevant past medical, surgical, family and social  history reviewed and updated as indicated. Interim medical history since our last visit reviewed. Allergies and medications reviewed and updated.  Review of Systems  Ten systems reviewed and is negative except as mentioned in HPI      Objective:     BP (!) 178/102   Pulse 99   Temp 97.6 F (36.4 C)   Ht 5' 6 (1.676 m)   Wt 183 lb (83 kg)   SpO2 96%   BMI 29.54 kg/m    Wt Readings from Last 3 Encounters:  07/28/24 183 lb (83 kg)  07/13/24 188 lb (85.3 kg)  06/21/24 188 lb (85.3 kg)    Physical Exam Constitutional:      Appearance: Normal appearance.  HENT:     Head: Normocephalic and atraumatic.  Cardiovascular:     Rate and Rhythm: Normal rate and regular rhythm.     Pulses: Normal pulses.     Heart sounds: Normal heart sounds.  Pulmonary:     Effort: Pulmonary effort is normal.     Breath sounds: Examination of  the right-upper field reveals wheezing. Examination of the left-upper field reveals wheezing. Examination of the right-middle field reveals wheezing. Examination of the left-middle field reveals wheezing. Wheezing present.  Musculoskeletal:        General: Normal range of motion.     Cervical back: Normal range of motion and neck supple.  Skin:    General: Skin is warm and dry.  Neurological:     General: No focal deficit present.     Mental Status: She is alert and oriented to person, place, and time.     Cranial Nerves: No cranial nerve deficit.     Motor: No weakness.  Psychiatric:        Mood and Affect: Mood normal.        Behavior: Behavior normal.        Thought Content: Thought content normal.        Judgment: Judgment normal.   Body mass index is 29.54 kg/m.  Waist Measurement : 41 inches   Diabetic Foot Exam - Simple   Simple Foot Form Visual Inspection No deformities, no ulcerations, no other skin breakdown bilaterally: Yes Sensation Testing See comments: Yes Pulse Check Posterior Tibialis and Dorsalis pulse intact bilaterally: Yes Comments Mild loss of sensation in bilateral feet      Results for orders placed or performed in visit on 07/28/24  POCT HgB A1C   Collection Time: 07/28/24 11:22 AM  Result Value Ref Range   Hemoglobin A1C 10.7 (A) 4.0 - 5.6 %   HbA1c POC (<> result, manual entry)     HbA1c, POC (prediabetic range)     HbA1c, POC (controlled diabetic range)            Assessment & Plan:   Problem List Items Addressed This Visit       Cardiovascular and Mediastinum   Primary hypertension   Was previously taking losartan -hydrochlorothiazide. BP today is 200/102 and 178/102. With patient being child bearing age and potential to get pregnant changing medication to amlodipine 5 mg + hydrochlorothiazide 12.5 mg. Please begin taking these medications daily. Plan to recheck BP at one week follow-up.       Relevant Medications   amLODipine  (NORVASC) 5 MG tablet   hydrochlorothiazide (HYDRODIURIL) 12.5 MG tablet   Other Relevant Orders   AMB Referral VBCI Care Management     Endocrine   Type 2 diabetes mellitus with hyperglycemia, without long-term current  use of insulin  (HCC) - Primary   POCT HgbA1C today is 10.7 She is not currently taking her metformin , glipizide, or semaglutide . Due to costs and running out of medication. We are starting patient on insulin  10 units once daily to help control her blood sugar and A1C. Glucometer supplies sent in. Discussed the importance of checking her blood sugar one a day. Patient to follow-up in one week and bring recorded readings.       Relevant Medications   insulin  glargine (LANTUS SOLOSTAR) 100 UNIT/ML Solostar Pen   Blood Glucose Monitoring Suppl DEVI   Glucose Blood (BLOOD GLUCOSE TEST STRIPS) STRP   Lancet Device MISC   Lancets MISC   metFORMIN  (GLUCOPHAGE -XR) 500 MG 24 hr tablet   Other Relevant Orders   POCT HgB A1C (Completed)   Ambulatory referral to Ophthalmology   Microalbumin / creatinine urine ratio   AMB Referral VBCI Care Management     Nervous and Auditory   Neuropathy   Continue taking Gabapentin  100 mg three times daily for neuropathy         Other   Anxiety   Continue taking buspar  and follow-up with psychiatry.       Other Visit Diagnoses       Palpitations       EKG ordered which was unremarkable. Zio heart monitor ordered.   Relevant Orders   EKG 12-Lead   LONG TERM MONITOR XT (3-14 DAYS)     Hemoglobin A1C 10.7 today. Due to uncontrolled DM, insulin  glargine pen sent in to pharmacy. Inject 10 units into the skin daily. Glucometer and supplies sent to pharmacy. Begin checking your blood sugar once daily.We will see patient in one week to check adherence to medication and follow-up on blood suagr readings. Discussed the importance of incorporating lifestyle interventions as well, such as, reducing sugar and carbohydrates in diet and getting 150  min of moderate intensity exercise weekly. At follow-up appointment we will check microalbumin/creatinine urine ratio.   Blood pressure significantly elevated today, 200/102 and 178/102. Patient asymptomatic. Sending in new blood pressure medication. Begin taking amlodipine 5 mg and hydrochlorothiazide 12.5 mg daily. We will recheck BP in one week. Work on reducing salt in diet and incorporate 150 min of moderate intensity exercise weekly.  For palpitations, zio heart monitor ordered for patient to wear for 14 days so we can track cardiac activity. EKG in office today was normal .  Continuing taking buspar  and following up with Psychiatry for anxiety.  GAD-7 was elevated today at 16. Lifestyle interventions such as mindfulness, meditation, and yoga can help with symptoms, along with sleep hygiene, adequate nutrition, and exercise.    Care Management referral sent in to Pharmacist for further management of medications and cost.         Follow up plan: Return in about 1 week (around 08/04/2024) for follow up.    I have reviewed this encounter including the documentation in this note and/or discussed this patient with the provider, Aislinn Womack, SNP, I am certifying that I agree with the content of this note as supervising/preceptor nurse practitioner.  Mliss Spray, FNP-C [facility] 07/28/2024, 12:45 PM

## 2024-07-28 NOTE — Assessment & Plan Note (Signed)
 Continue taking Gabapentin  100 mg three times daily for neuropathy

## 2024-08-01 ENCOUNTER — Ambulatory Visit: Admitting: Family Medicine

## 2024-08-01 ENCOUNTER — Telehealth: Payer: Self-pay

## 2024-08-01 NOTE — Telephone Encounter (Signed)
 Called and notified she can go ahead and place monitor on

## 2024-08-01 NOTE — Telephone Encounter (Unsigned)
 Copied from CRM [id number]. Topic: Clinical - Medical Advice >> Aug 01, 2024  3:19 PM Shanda MATSU wrote: Reason for CRM: Patient called in stating that she has recvd ECG monitor, is wanting to know if she should already put it on or wait till her next appt, is req a call back in regards to this.

## 2024-08-03 ENCOUNTER — Ambulatory Visit: Admitting: Nurse Practitioner

## 2024-08-03 ENCOUNTER — Telehealth: Payer: Self-pay | Admitting: Family Medicine

## 2024-08-03 NOTE — Telephone Encounter (Unsigned)
 Copied from CRM #[id number]. Topic: General - Other >> Aug 03, 2024  4:43 PM Victoria B wrote: Reason for CRM: Shanell from [employer] called in about fax sent on 11/18 for request for med change from lantus to rezboglar. Reference [id number]. Wants response back if its ok for med change

## 2024-08-03 NOTE — Progress Notes (Deleted)
   There were no vitals taken for this visit.   Subjective:    Patient ID: Janice Kaufman, female    DOB: Aug 20, 1991, 33 y.o.   MRN: [id number]  HPI: Janice Kaufman is a 33 y.o. female with a history of uncontrolled type 2 diabetes, neuropathy, hypertension, anxiety, and bipolar 2 disorder, who presents today for a 1 week follow-up.  At her visit on 07/28/24 POCT A1C was 10.7. She had not been compliant with taking her medications. Insulin  10 units once daily was ordered to help control blood sugar and A1C. Glucometer supplies also sent in. She   Her blood pressure was elevated at last visit with readings of 200/102 and 178/102. She had not been taking her losartan -hydrochlorothiazide combo medication. Patient was started on amlodipine 5 mg and hydrochlorothiazide 12.5 mg daily.              07/28/2024   11:21 AM 07/13/2024    3:02 PM 06/21/2024    1:56 PM  Depression screen PHQ 2/9  Decreased Interest 1 0 3  Down, Depressed, Hopeless 1 0 3  PHQ - 2 Score 2 0 6  Altered sleeping 2 0 1  Tired, decreased energy 1 0 1  Change in appetite 3 0 1  Feeling bad or failure about yourself  2 0 0  Trouble concentrating 1 0 0  Moving slowly or fidgety/restless 1 0 0  Suicidal thoughts 0 0 0  PHQ-9 Score 12 0  9      Data saved with a previous flowsheet row definition    Relevant past medical, surgical, family and social history reviewed and updated as indicated. Interim medical history since our last visit reviewed. Allergies and medications reviewed and updated.  Review of Systems  Per HPI unless specifically indicated above     Objective:     There were no vitals taken for this visit.  {Vitals History (Optional):23777} Wt Readings from Last 3 Encounters:  07/28/24 183 lb (83 kg)  07/13/24 188 lb (85.3 kg)  06/21/24 188 lb (85.3 kg)    Physical Exam   Results for orders placed or performed in visit on 07/28/24  POCT HgB A1C   Collection Time: 07/28/24 11:22 AM   Result Value Ref Range   Hemoglobin A1C 10.7 (A) 4.0 - 5.6 %   HbA1c POC (<> result, manual entry)     HbA1c, POC (prediabetic range)     HbA1c, POC (controlled diabetic range)     {Labs (Optional):23779}       Assessment & Plan:   Problem List Items Addressed This Visit   None    Assessment and Plan         Follow up plan: No follow-ups on file.

## 2024-08-04 NOTE — Telephone Encounter (Signed)
Faxed form in.

## 2024-08-09 ENCOUNTER — Ambulatory Visit

## 2024-08-15 ENCOUNTER — Telehealth: Payer: Self-pay

## 2024-08-15 NOTE — Progress Notes (Unsigned)
 Complex Care Management Note Care Guide Note  08/15/2024 Name: Janice Kaufman MRN: [id number] DOB: 08/11/1991   Complex Care Management Outreach Attempts: An unsuccessful telephone outreach was attempted today to offer the patient information about available complex care management services.  Follow Up Plan:  Additional outreach attempts will be made to offer the patient complex care management information and services.   Encounter Outcome:  No Answer  Dreama [facility]  [facility] VBCI Assistant Direct Dial: [phone]  Fax: [phone]

## 2024-08-18 NOTE — Progress Notes (Signed)
 Complex Care Management Note Care Guide Note  08/18/2024 Name: Janice Kaufman MRN: [id number] DOB: Apr 12, 1991   Complex Care Management Outreach Attempts: A second unsuccessful outreach was attempted today to offer the patient with information about available complex care management services.  Follow Up Plan:  No further outreach attempts will be made at this time. We have been unable to contact the patient to offer or enroll patient in complex care management services.  Encounter Outcome:  No Answer  Dreama [facility]  [facility], [facility] VBCI Assistant Direct Dial: [phone]  Fax: [phone]

## 2024-08-22 ENCOUNTER — Encounter: Payer: Self-pay | Admitting: Nurse Practitioner

## 2024-08-22 ENCOUNTER — Ambulatory Visit: Admitting: Nurse Practitioner

## 2024-08-22 VITALS — BP 150/92 | HR 93 | Temp 97.9°F | Ht 66.0 in | Wt 188.0 lb

## 2024-08-22 DIAGNOSIS — F419 Anxiety disorder, unspecified: Secondary | ICD-10-CM | POA: Diagnosis not present

## 2024-08-22 DIAGNOSIS — I10 Essential (primary) hypertension: Secondary | ICD-10-CM | POA: Diagnosis not present

## 2024-08-22 DIAGNOSIS — E1165 Type 2 diabetes mellitus with hyperglycemia: Secondary | ICD-10-CM

## 2024-08-22 DIAGNOSIS — E66811 Obesity, class 1: Secondary | ICD-10-CM | POA: Diagnosis not present

## 2024-08-22 DIAGNOSIS — Z6830 Body mass index (BMI) 30.0-30.9, adult: Secondary | ICD-10-CM | POA: Diagnosis not present

## 2024-08-22 DIAGNOSIS — F3181 Bipolar II disorder: Secondary | ICD-10-CM

## 2024-08-22 DIAGNOSIS — G629 Polyneuropathy, unspecified: Secondary | ICD-10-CM

## 2024-08-22 DIAGNOSIS — Z794 Long term (current) use of insulin: Secondary | ICD-10-CM | POA: Diagnosis not present

## 2024-08-22 DIAGNOSIS — Z720 Tobacco use: Secondary | ICD-10-CM

## 2024-08-22 DIAGNOSIS — R062 Wheezing: Secondary | ICD-10-CM | POA: Diagnosis not present

## 2024-08-22 MED ORDER — AMLODIPINE BESYLATE 10 MG PO TABS: 10.0000 mg | ORAL_TABLET | Freq: Every day | ORAL | Status: AC

## 2024-08-22 MED ORDER — ALBUTEROL SULFATE HFA 108 (90 BASE) MCG/ACT IN AERS: 1.0000 | INHALATION_SPRAY | Freq: Four times a day (QID) | RESPIRATORY_TRACT | 5 refills | Status: AC | PRN

## 2024-08-22 MED ORDER — GABAPENTIN 100 MG PO CAPS: 100.0000 mg | ORAL_CAPSULE | Freq: Three times a day (TID) | ORAL | 1 refills | Status: AC

## 2024-08-22 NOTE — Progress Notes (Signed)
 BP (!) 150/92   Pulse 93   Temp 97.9 F (36.6 C)   Ht 5' 6 (1.676 m)   Wt 188 lb (85.3 kg)   SpO2 98%   BMI 30.34 kg/m    Subjective:    Patient ID: Janice Kaufman, female    DOB: 1991/01/12, 33 y.o.   MRN: [id number]  HPI: Janice Kaufman is a 33 y.o. female  Chief Complaint  Patient presents with   Medical Management of Chronic Issues    Heart monitor results.    Discussed the use of AI scribe software for clinical note transcription with the patient, who gave verbal consent to proceed.  History of Present Illness Janice Kaufman is a 33 year old female with hypertension and type 2 diabetes who presents for follow-up.  Hyperglycemia and diabetes management - Type 2 diabetes with poor glycemic control. - A1c was 10.7 on July 28, 2024. - Started on insulin  (Lantus ) 10 units daily and metformin  1000 mg daily at last visit. - Current blood glucose readings remain elevated: 363, 288, and 310 mg/dL before breakfast. - Previously on metformin , glipizide, and semaglutide , but had issues with medication adherence due to cost and running out of medication. - Currently taking metformin  1000 mg daily; experiences gastrointestinal discomfort with metformin , describing it as 'tears up my stomach,' but is attempting to tolerate it until able to consult with a pharmacist. - Provided with a glucometer for home monitoring.  Hypertension management - Hypertension with current blood pressure of 140/98 mmHg at this visit. - Previous blood pressure was 178/102 mmHg in November. - Currently taking hydrochlorothiazide  12.5 mg daily and amlodipine  5 mg daily. - No leg swelling.  Peripheral neuropathy - History of neuropathy. - Currently taking gabapentin  100 mg three times daily. - Needs a refill of gabapentin .  Cardiac symptoms - Experiences palpitations. - Previously ordered a heart monitor (ZioPatch); results pending. - EKG performed previously was  unremarkable.  Respiratory symptoms - Chronic cough and shortness of breath, attributed to smoking. - Experiences wheezing. - Previously used an inhaler with symptomatic improvement. - No history of asthma. - Routine shortness of breath.  Genitourinary symptoms - Frequent urination.  Psychiatric history - History of anxiety and bipolar disorder, managed by psychiatry. - Currently taking buspirone .         07/28/2024   11:21 AM 07/13/2024    3:02 PM 06/21/2024    1:56 PM  Depression screen PHQ 2/9  Decreased Interest 1 0 3  Down, Depressed, Hopeless 1 0 3  PHQ - 2 Score 2 0 6  Altered sleeping 2 0 1  Tired, decreased energy 1 0 1  Change in appetite 3 0 1  Feeling bad or failure about yourself  2 0 0  Trouble concentrating 1 0 0  Moving slowly or fidgety/restless 1 0 0  Suicidal thoughts 0 0 0  PHQ-9 Score 12 0  9      Data saved with a previous flowsheet row definition    Relevant past medical, surgical, family and social history reviewed and updated as indicated. Interim medical history since our last visit reviewed. Allergies and medications reviewed and updated.  Review of Systems  Constitutional: Negative for fever or weight change.  Respiratory: Negative for cough and shortness of breath.   Cardiovascular: Negative for chest pain or palpitations.  Gastrointestinal: Negative for abdominal pain, no bowel changes.  Musculoskeletal: Negative for gait problem or joint swelling.  Skin: Negative for rash.  Neurological: Negative for dizziness or  headache.  No other specific complaints in a complete review of systems (except as listed in HPI above).      Objective:      BP (!) 150/92   Pulse 93   Temp 97.9 F (36.6 C)   Ht 5' 6 (1.676 m)   Wt 188 lb (85.3 kg)   SpO2 98%   BMI 30.34 kg/m    Wt Readings from Last 3 Encounters:  08/22/24 188 lb (85.3 kg)  07/28/24 183 lb (83 kg)  07/13/24 188 lb (85.3 kg)    Physical Exam VITALS: BP-  140/98 MEASUREMENTS: Weight- 188, BMI- 30.34. GENERAL: Alert, cooperative, well developed, no acute distress HEENT: Normocephalic, normal oropharynx, moist mucous membranes CHEST: Clear to auscultation bilaterally, no wheezes, rhonchi, or crackles, lungs normal CARDIOVASCULAR: Normal heart rate and rhythm, S1 and S2 normal without murmurs ABDOMEN: Soft, non-tender, non-distended, without organomegaly, normal bowel sounds EXTREMITIES: No cyanosis or edema NEUROLOGICAL: Cranial nerves grossly intact, moves all extremities without gross motor or sensory deficit  Results for orders placed or performed in visit on 07/28/24  POCT HgB A1C   Collection Time: 07/28/24 11:22 AM  Result Value Ref Range   Hemoglobin A1C 10.7 (A) 4.0 - 5.6 %   HbA1c POC (<> result, manual entry)     HbA1c, POC (prediabetic range)     HbA1c, POC (controlled diabetic range)            Assessment & Plan:   Problem List Items Addressed This Visit       Cardiovascular and Mediastinum   Primary hypertension - Primary   Relevant Medications   amLODipine  (NORVASC ) 10 MG tablet     Endocrine   Type 2 diabetes mellitus with hyperglycemia, without long-term current use of insulin  (HCC)     Nervous and Auditory   Neuropathy   Relevant Medications   gabapentin  (NEURONTIN ) 100 MG capsule     Other   Bipolar 2 disorder (HCC)   Anxiety   Class 1 obesity with serious comorbidity and body mass index (BMI) of 30.0 to 30.9 in adult   Other Visit Diagnoses       Tobacco use         Wheezing       Relevant Medications   albuterol  (VENTOLIN  HFA) 108 (90 Base) MCG/ACT inhaler        Assessment and Plan Assessment & Plan Type 2 diabetes mellitus with hyperglycemia A1c remains elevated at 10.7. Blood glucose levels are significantly above target, with readings of 363 mg/dL, 711 mg/dL, and 689 mg/dL. Current regimen includes insulin  10 units daily, metformin  1000 mg daily, and previous medications glipizide and  semaglutide  were not taken due to cost and availability. Referral to pharmacist for medication management was made but not yet attended. - Increase insulin  by 2 units every third day until blood glucose reaches 150 mg/dL or until pharmacist appointment. - Continue metformin  until pharmacist appointment, with option to reduce dosage if gastrointestinal side effects are intolerable. - Ensure follow-up with pharmacist for medication management and potential GLP-1 agonist therapy.  Essential hypertension Blood pressure improved to 140/98 mmHg from previous 178/102 mmHg but remains above goal. Current regimen includes hydrochlorothiazide  12.5 mg daily and amlodipine  5 mg daily. No significant side effects reported from current medications. - Increased amlodipine  to 10 mg daily. - Rechecked blood pressure before leaving the office.  Polyneuropathy Managed with gabapentin  100 mg three times daily. No new symptoms reported. - Refilled gabapentin  prescription.  Obesity, class 1  BMI is 30.34, indicating class 1 obesity. Weight management is a concern in the context of diabetes and hypertension.  Wheezing and chronic cough Chronic cough and wheezing, likely related to tobacco use. Previous use of inhaler was beneficial. No asthma diagnosis. Reports shortness of breath and wheezing. - Prescribed inhaler for symptomatic relief. - Encouraged reduction in tobacco use.  Tobacco use Continued tobacco use contributing to respiratory symptoms. Acknowledges difficulty in quitting. - Advised reduction in tobacco use.        Follow up plan: Return for follow up.

## 2024-09-06 ENCOUNTER — Ambulatory Visit

## 2024-09-06 DIAGNOSIS — Z794 Long term (current) use of insulin: Secondary | ICD-10-CM | POA: Diagnosis not present

## 2024-09-06 DIAGNOSIS — E1165 Type 2 diabetes mellitus with hyperglycemia: Secondary | ICD-10-CM

## 2024-09-06 NOTE — Progress Notes (Signed)
 "  S:     Reason for visit: ?  Janice Kaufman is a 33 y.o. female with a history of diabetes (type 2), who presents today for an initial diabetes Face to Face pharmacotherapy visit.? Pertinent PMH also includes HTN, neuropathy, tobacco use disorder, .  They were referred to the pharmacist by their PCP for assistance in managing diabetes, hypertension, and medication access.   Care Team: Primary Care Provider: Gareth Mliss FALCON, FNP  At last visit with PCP on 08/22/24, BP was elevated at 150/92 mmHg. Patient was instructed to increase amlodipine  from 5 mg to 10 mg daily. Patient was instructed to increase Lantus  by 2 units every 3 days for FBP >150 mg/dL.  Today, patient presented to clinic with her husband. She reports she did self-increase her Lantus  to 20 units, but she was unable to confirm any home BG readings. She also reported she self-discontinued metformin  for about a week. She is switching to a new insurance plan in January  Current diabetes medications include: Lantus  20 units daily, metformin  XR 500 mg BID (not taking) Previous diabetes medications include: Ozempic  (GI upset), Janumet (cost) Current hypertension medications include: amlodipine  10 mg daily, hydrochlorothiazide  12.5 mg daily  Patient denies adherence with medications, reports missing all medications 1-2 times per week, on average. She has not been taking metformin  for the past week d/t GI upset.   Have you been experiencing any side effects to the medications prescribed? Yes - severe GI upset with metformin  Do you have any problems obtaining medications due to transportation or finances? yes Insurance coverage: [employer]  Patient denies hypoglycemic symptoms  Reported home fasting blood sugars: not checking  Hypertension: Patient has a home BP cuff Current blood pressure readings readings: not checking at home  Patient reports rare hypotensive s/sx including dizziness, lightheadedness with  positional changes  Patient did not take antihypertensives today   DM Prevention:  Statin: no ACE/ARB: no; child-bearing age Last urinary albumin/creatinine ratio:   Last eye exam:     Last foot exam: 04/27/2024 Tobacco Use:  Tobacco Use: High Risk (08/22/2024)   Patient History    Smoking Tobacco Use: Every Day    Smokeless Tobacco Use: Never    Passive Exposure: Not on file   O:  Vitals:  Wt Readings from Last 3 Encounters:  08/22/24 188 lb (85.3 kg)  07/28/24 183 lb (83 kg)  07/13/24 188 lb (85.3 kg)   BP Readings from Last 3 Encounters:  09/06/24 137/89  08/22/24 (!) 150/92  07/28/24 (!) 178/102   Pulse Readings from Last 3 Encounters:  09/06/24 94  08/22/24 93  07/28/24 99     Labs:?  Lab Results  Component Value Date   HGBA1C 10.7 (A) 07/28/2024   HGBA1C 10.2 (A) 04/27/2024   HGBA1C 12.7 02/17/2024   GLUCOSE 223 (H) 04/27/2024   CREATININE 0.61 04/27/2024   CREATININE 0.6 02/17/2024   CREATININE 0.67 11/11/2023   GFR 126.28 11/16/2018    Lab Results  Component Value Date   CHOL 189 02/17/2024   LDLCALC 119 02/17/2024   LDLCALC 81 10/29/2018   HDL 28 (A) 02/17/2024   TRIG 235 (A) 02/17/2024   TRIG 193 (H) 10/29/2018   ALT 15 04/27/2024   ALT 20 02/17/2024   AST 11 04/27/2024   AST 12 (A) 02/17/2024      Chemistry      Component Value Date/Time   NA 136 04/27/2024 1604   NA 138 02/17/2024 0000   K  4.4 04/27/2024 1604   CL 103 04/27/2024 1604   CO2 23 04/27/2024 1604   BUN 16 04/27/2024 1604   BUN 10 02/17/2024 0000   CREATININE 0.61 04/27/2024 1604   GLU 335 02/17/2024 0000      Component Value Date/Time   CALCIUM  9.5 04/27/2024 1604   ALKPHOS 152 (A) 02/17/2024 0000   AST 11 04/27/2024 1604   ALT 15 04/27/2024 1604   BILITOT 0.3 04/27/2024 1604   BILITOT 0.3 11/23/2019 1058       The ASCVD Risk score (Arnett DK, et al., 2019) failed to calculate for the following reasons:   The 2019 ASCVD risk score is only valid for ages 37  to 12   * - Cholesterol units were assumed     A/P: Diabetes currently uncontrolled with a most recent A1c of 10.7% on 07/28/24, which is up from 10.2% on 04/27/24. Difficult to fully assess given lack of BG readings. Patient is able to verbalize appropriate hypoglycemia management plan. Medication adherence appears suboptimal. She reports she will sometimes forget to take her medications about once per week and temporarily discontinued metformin  d/t GI upset. 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 Because of lack of BG readings and insurance changes in the new year, will maintain current regimen at this time. May discuss re-initiation of a GLP1 agent in January. Will restart metformin  at a dose of once daily in the interim. Patient reports she currently has a few months worth of Dexcom G7 at home to assist with BG monitoring.  -Continued basal insulin  Lantus  (insulin  glargine)  20 units daily.  -Restarted metformin  XR 500 mg daily given hx of GI upset.  -Patient educated on purpose, proper use, and potential adverse effects of metformin .  -Extensively discussed pathophysiology of diabetes, recommended lifestyle interventions, dietary effects on blood sugar control.  -Counseled on s/sx of and management of hypoglycemia.  -Next A1c anticipated 10/2024.  -Counseled on adherence methods, such as a pill box and phone alarms -Counseled on use of Dexcom G7 CGM. Patient agreeable to begin using again.   Hypertension longstanding currently uncontrolled, however, patient did not take any BP medications today. Blood pressure goal of <130/80 mmHg. Medication adherence suboptimal.  -Continued amlodipine  10 mg daily. -Continued hydrochlorothiazide  12.5 mg daily -Counseled on lifestyle measures for hypertension control, such as physical activity, sodium content, and tobacco cessation -Counseled to begin monitoring BP at home 2-3x times per week  Patient verbalized understanding of  treatment plan. Total time patient counseling 45 minutes.  Follow-up:  Pharmacist on 09/20/24 PCP clinic visit on 10/12/24  Peyton CHARLENA Ferries, PharmD, CPP Clinical Pharmacist [facility] [phone]   "

## 2024-09-20 ENCOUNTER — Ambulatory Visit

## 2024-09-20 VITALS — BP 124/84 | HR 99

## 2024-09-20 DIAGNOSIS — Z794 Long term (current) use of insulin: Secondary | ICD-10-CM | POA: Diagnosis not present

## 2024-09-20 DIAGNOSIS — E1165 Type 2 diabetes mellitus with hyperglycemia: Secondary | ICD-10-CM

## 2024-09-20 MED ORDER — TIRZEPATIDE 2.5 MG/0.5ML ~~LOC~~ SOAJ: 2.5000 mg | SUBCUTANEOUS | 0 refills | Status: AC

## 2024-09-20 MED ORDER — LANTUS SOLOSTAR 100 UNIT/ML ~~LOC~~ SOPN: 24.0000 [IU] | PEN_INJECTOR | Freq: Every day | SUBCUTANEOUS | 3 refills | Status: AC

## 2024-09-20 NOTE — Progress Notes (Signed)
 "  S:     Reason for visit: ?  Janice Kaufman is a 34 y.o. female with a history of diabetes (type 2), who presents today for an initial diabetes Face to Face pharmacotherapy visit.? Pertinent PMH also includes HTN, neuropathy, tobacco use disorder, .  They were referred to the pharmacist by their PCP for assistance in managing diabetes, hypertension, and medication access.   Care Team: Primary Care Provider: Gareth Mliss FALCON, FNP  At last visit with clinical pharmacist on 09/06/24, patient was instructed to begin monitoring BG via Dexcom G7.    Today, patient reports she has been able to remember to take her medications every single day since our last visit. She reports she continues to experience severe GI upset with metformin . She reports some rare palpitation sensations  Current diabetes medications include: Lantus  20 units daily, metformin  XR 500 mg BID  Previous diabetes medications include: Ozempic  (GI upset), Janumet (cost) Current hypertension medications include: amlodipine  10 mg daily, hydrochlorothiazide  12.5 mg daily  Patient endorses adherence with medications.  Have you been experiencing any side effects to the medications prescribed? Yes - severe GI upset with metformin  Do you have any problems obtaining medications due to transportation or finances? yes Insurance coverage: [employer]st  Patient denies hypoglycemic events  Hypertension: Patient has a home BP cuff, but cannot find it Current blood pressure readings readings: not checking  Patient reports rare hypotensive s/sx including dizziness, lightheadedness with positional changes  Patient did take her antihypertensives today   DM Prevention:  Statin: no ACE/ARB: no; child-bearing age Last urinary albumin/creatinine ratio:   Last eye exam:     Last foot exam: 04/27/2024 Tobacco Use:  Tobacco Use: High Risk (08/22/2024)   Patient History    Smoking Tobacco Use: Every Day    Smokeless  Tobacco Use: Never    Passive Exposure: Not on file   O:  LibreView Report:   Vitals:  Wt Readings from Last 3 Encounters:  08/22/24 188 lb (85.3 kg)  07/28/24 183 lb (83 kg)  07/13/24 188 lb (85.3 kg)   BP Readings from Last 3 Encounters:  09/20/24 124/84  09/06/24 137/89  08/22/24 (!) 150/92   Pulse Readings from Last 3 Encounters:  09/20/24 99  09/06/24 94  08/22/24 93     Labs:?  Lab Results  Component Value Date   HGBA1C 10.7 (A) 07/28/2024   HGBA1C 10.2 (A) 04/27/2024   HGBA1C 12.7 02/17/2024   GLUCOSE 223 (H) 04/27/2024   CREATININE 0.61 04/27/2024   CREATININE 0.6 02/17/2024   CREATININE 0.67 11/11/2023   GFR 126.28 11/16/2018    Lab Results  Component Value Date   CHOL 189 02/17/2024   LDLCALC 119 02/17/2024   LDLCALC 81 10/29/2018   HDL 28 (A) 02/17/2024   TRIG 235 (A) 02/17/2024   TRIG 193 (H) 10/29/2018   ALT 15 04/27/2024   ALT 20 02/17/2024   AST 11 04/27/2024   AST 12 (A) 02/17/2024      Chemistry      Component Value Date/Time   NA 136 04/27/2024 1604   NA 138 02/17/2024 0000   K 4.4 04/27/2024 1604   CL 103 04/27/2024 1604   CO2 23 04/27/2024 1604   BUN 16 04/27/2024 1604   BUN 10 02/17/2024 0000   CREATININE 0.61 04/27/2024 1604   GLU 335 02/17/2024 0000      Component Value Date/Time   CALCIUM  9.5 04/27/2024 1604   ALKPHOS 152 (A) 02/17/2024 0000  AST 11 04/27/2024 1604   ALT 15 04/27/2024 1604   BILITOT 0.3 04/27/2024 1604   BILITOT 0.3 11/23/2019 1058       The ASCVD Risk score (Arnett DK, et al., 2019) failed to calculate for the following reasons:   The 2019 ASCVD risk score is only valid for ages 38 to 73   * - Cholesterol units were assumed     A/P: Diabetes currently uncontrolled with a most recent A1c of 10.7% on 07/28/24, which is up from 10.2% on 04/27/24. Patient is able to verbalize appropriate hypoglycemia management plan. Medication adherence appears appropriate. Congratulated patient on her improvement  with medication adherence. She continues to experience severe GI distress with metformin , so will discontinue at this time and increase her insulin  by about 10%. It appears that Mounjaro  is on her formulary for her new insurance - will send prescription to the pharmacy.   -Increased dose of basal insulin  Lantus  (insulin  glargine)  to 24 units daily. Told patient to reach out to clinic if Mounjaro  is expensive on insurance to discuss titration plan for insulin  at home  -Discontinued metformin  XR 500 mg daily given hx of GI upset.  -Started GLP-1 Mounjaro  (tirzepatide )  2.5 mg weekly -Patient educated on purpose, proper use, and potential adverse effects of metformin .  -Extensively discussed pathophysiology of diabetes, recommended lifestyle interventions, dietary effects on blood sugar control.  -Counseled on s/sx of and management of hypoglycemia.  -Next A1c anticipated 10/2024.  -Counseled on adherence methods, such as a pill box and phone alarms -Continue monitoring BG via Dexcom G7  Hypertension longstanding currently close to goal. Blood pressure goal of <130/80 mmHg. Medication adherence improved.  -Continued amlodipine  10 mg daily. -Continued hydrochlorothiazide  12.5 mg daily -Counseled on lifestyle measures for hypertension control, such as physical activity, sodium content, and tobacco cessation -Counseled to begin monitoring BP at home 2-3x times per week  Patient verbalized understanding of treatment plan. Total time patient counseling 45 minutes.  Follow-up:  Pharmacist on 10/24/24 PCP clinic visit on 10/24/24  Peyton CHARLENA Ferries, PharmD, CPP Clinical Pharmacist [facility] [phone]   "

## 2024-09-21 ENCOUNTER — Ambulatory Visit: Payer: Self-pay | Admitting: Nurse Practitioner

## 2024-09-21 DIAGNOSIS — R002 Palpitations: Secondary | ICD-10-CM

## 2024-10-12 ENCOUNTER — Ambulatory Visit

## 2024-10-24 ENCOUNTER — Ambulatory Visit

## 2024-10-24 ENCOUNTER — Ambulatory Visit: Admitting: Nurse Practitioner

## 2024-11-14 ENCOUNTER — Ambulatory Visit
